# Patient Record
Sex: Female | Born: 1965 | Race: White | Hispanic: No | Smoking: Never smoker
Health system: Southern US, Community
[De-identification: ages and names within clinical notes are randomized; demographics above are authoritative.]

## PROBLEM LIST (undated history)

## (undated) DIAGNOSIS — E119 Type 2 diabetes mellitus without complications: Secondary | ICD-10-CM

## (undated) DIAGNOSIS — I219 Acute myocardial infarction, unspecified: Secondary | ICD-10-CM

## (undated) DIAGNOSIS — I10 Essential (primary) hypertension: Secondary | ICD-10-CM

## (undated) DIAGNOSIS — Z9889 Other specified postprocedural states: Secondary | ICD-10-CM

## (undated) DIAGNOSIS — R112 Nausea with vomiting, unspecified: Secondary | ICD-10-CM

## (undated) DIAGNOSIS — I2510 Atherosclerotic heart disease of native coronary artery without angina pectoris: Secondary | ICD-10-CM

## (undated) DIAGNOSIS — F32A Depression, unspecified: Secondary | ICD-10-CM

## (undated) DIAGNOSIS — F419 Anxiety disorder, unspecified: Secondary | ICD-10-CM

## (undated) HISTORY — PX: GANGLION CYST EXCISION: SHX1691

## (undated) HISTORY — PX: CORONARY ARTERY BYPASS GRAFT: SHX141

---

## 2004-12-16 ENCOUNTER — Ambulatory Visit: Payer: Self-pay | Admitting: Cardiovascular Disease

## 2005-01-06 ENCOUNTER — Encounter: Payer: Self-pay | Admitting: Internal Medicine

## 2005-01-17 ENCOUNTER — Encounter: Payer: Self-pay | Admitting: Internal Medicine

## 2005-02-17 ENCOUNTER — Encounter: Payer: Self-pay | Admitting: Internal Medicine

## 2005-03-20 ENCOUNTER — Encounter: Payer: Self-pay | Admitting: Internal Medicine

## 2005-09-15 ENCOUNTER — Ambulatory Visit: Payer: Self-pay | Admitting: Internal Medicine

## 2006-11-22 ENCOUNTER — Ambulatory Visit: Payer: Self-pay | Admitting: Internal Medicine

## 2007-02-28 ENCOUNTER — Ambulatory Visit: Payer: Self-pay | Admitting: Unknown Physician Specialty

## 2007-04-21 ENCOUNTER — Ambulatory Visit: Payer: Self-pay | Admitting: Internal Medicine

## 2009-02-03 ENCOUNTER — Emergency Department: Payer: Self-pay | Admitting: Emergency Medicine

## 2009-07-22 ENCOUNTER — Inpatient Hospital Stay: Payer: Self-pay | Admitting: Internal Medicine

## 2010-05-20 ENCOUNTER — Ambulatory Visit: Payer: Self-pay | Admitting: Internal Medicine

## 2010-07-16 ENCOUNTER — Ambulatory Visit: Payer: Self-pay | Admitting: Obstetrics & Gynecology

## 2010-07-20 HISTORY — PX: ABDOMINAL HYSTERECTOMY: SHX81

## 2010-07-24 ENCOUNTER — Ambulatory Visit: Payer: Self-pay | Admitting: Obstetrics & Gynecology

## 2010-07-26 LAB — PATHOLOGY REPORT

## 2010-10-13 ENCOUNTER — Ambulatory Visit: Payer: Self-pay | Admitting: Internal Medicine

## 2011-10-22 ENCOUNTER — Ambulatory Visit: Payer: Self-pay | Admitting: Internal Medicine

## 2012-04-04 ENCOUNTER — Ambulatory Visit: Payer: Self-pay | Admitting: Internal Medicine

## 2013-02-14 ENCOUNTER — Ambulatory Visit: Payer: Self-pay | Admitting: Internal Medicine

## 2013-02-23 ENCOUNTER — Ambulatory Visit: Payer: Self-pay | Admitting: Internal Medicine

## 2014-06-22 ENCOUNTER — Ambulatory Visit: Payer: Self-pay | Admitting: Nurse Practitioner

## 2015-07-24 ENCOUNTER — Other Ambulatory Visit: Payer: Self-pay | Admitting: Nurse Practitioner

## 2015-07-24 DIAGNOSIS — Z1231 Encounter for screening mammogram for malignant neoplasm of breast: Secondary | ICD-10-CM

## 2015-08-13 ENCOUNTER — Ambulatory Visit
Admission: RE | Admit: 2015-08-13 | Discharge: 2015-08-13 | Disposition: A | Discharge status: Home / Self Care | Payer: BLUE CROSS/BLUE SHIELD | Source: Ambulatory Visit | Attending: Nurse Practitioner | Admitting: Nurse Practitioner

## 2015-08-13 DIAGNOSIS — Z1231 Encounter for screening mammogram for malignant neoplasm of breast: Secondary | ICD-10-CM | POA: Diagnosis present

## 2016-06-23 ENCOUNTER — Other Ambulatory Visit: Payer: Self-pay | Admitting: Nurse Practitioner

## 2016-06-23 DIAGNOSIS — Z1231 Encounter for screening mammogram for malignant neoplasm of breast: Secondary | ICD-10-CM

## 2016-08-17 ENCOUNTER — Ambulatory Visit
Admission: RE | Admit: 2016-08-17 | Discharge: 2016-08-17 | Disposition: A | Discharge status: Home / Self Care | Payer: 59 | Source: Ambulatory Visit | Attending: Nurse Practitioner | Admitting: Nurse Practitioner

## 2016-08-17 DIAGNOSIS — Z1231 Encounter for screening mammogram for malignant neoplasm of breast: Secondary | ICD-10-CM | POA: Diagnosis present

## 2017-02-08 ENCOUNTER — Other Ambulatory Visit: Payer: Self-pay | Admitting: Nurse Practitioner

## 2017-02-08 DIAGNOSIS — E049 Nontoxic goiter, unspecified: Secondary | ICD-10-CM

## 2017-02-12 ENCOUNTER — Ambulatory Visit
Admission: RE | Admit: 2017-02-12 | Discharge: 2017-02-12 | Disposition: A | Discharge status: Home / Self Care | Payer: BLUE CROSS/BLUE SHIELD | Source: Ambulatory Visit | Attending: Nurse Practitioner | Admitting: Nurse Practitioner

## 2017-02-12 DIAGNOSIS — E049 Nontoxic goiter, unspecified: Secondary | ICD-10-CM | POA: Insufficient documentation

## 2017-06-30 ENCOUNTER — Other Ambulatory Visit: Payer: Self-pay | Admitting: Nurse Practitioner

## 2017-06-30 DIAGNOSIS — Z1231 Encounter for screening mammogram for malignant neoplasm of breast: Secondary | ICD-10-CM

## 2017-08-18 ENCOUNTER — Ambulatory Visit
Admission: RE | Admit: 2017-08-18 | Discharge: 2017-08-18 | Disposition: A | Discharge status: Home / Self Care | Payer: BLUE CROSS/BLUE SHIELD | Source: Ambulatory Visit | Attending: Nurse Practitioner | Admitting: Nurse Practitioner

## 2017-08-18 DIAGNOSIS — Z1231 Encounter for screening mammogram for malignant neoplasm of breast: Secondary | ICD-10-CM | POA: Diagnosis present

## 2018-08-12 ENCOUNTER — Other Ambulatory Visit: Payer: Self-pay | Admitting: Nurse Practitioner

## 2018-08-12 DIAGNOSIS — Z1231 Encounter for screening mammogram for malignant neoplasm of breast: Secondary | ICD-10-CM

## 2018-09-08 ENCOUNTER — Ambulatory Visit
Admission: RE | Admit: 2018-09-08 | Discharge: 2018-09-08 | Disposition: A | Discharge status: Home / Self Care | Payer: BLUE CROSS/BLUE SHIELD | Source: Ambulatory Visit | Attending: Nurse Practitioner | Admitting: Nurse Practitioner

## 2018-09-08 DIAGNOSIS — Z1231 Encounter for screening mammogram for malignant neoplasm of breast: Secondary | ICD-10-CM | POA: Insufficient documentation

## 2019-10-02 ENCOUNTER — Other Ambulatory Visit: Payer: Self-pay | Admitting: Nurse Practitioner

## 2019-10-02 DIAGNOSIS — Z1231 Encounter for screening mammogram for malignant neoplasm of breast: Secondary | ICD-10-CM

## 2019-10-24 ENCOUNTER — Ambulatory Visit
Admission: RE | Admit: 2019-10-24 | Discharge: 2019-10-24 | Disposition: A | Discharge status: Home / Self Care | Payer: BLUE CROSS/BLUE SHIELD | Source: Ambulatory Visit | Attending: Nurse Practitioner | Admitting: Nurse Practitioner

## 2019-10-24 DIAGNOSIS — Z1231 Encounter for screening mammogram for malignant neoplasm of breast: Secondary | ICD-10-CM | POA: Insufficient documentation

## 2020-02-16 ENCOUNTER — Ambulatory Visit (LOCAL_COMMUNITY_HEALTH_CENTER): Payer: Self-pay

## 2020-02-16 ENCOUNTER — Other Ambulatory Visit: Payer: Self-pay

## 2020-02-16 DIAGNOSIS — Z111 Encounter for screening for respiratory tuberculosis: Secondary | ICD-10-CM

## 2020-02-19 ENCOUNTER — Ambulatory Visit (LOCAL_COMMUNITY_HEALTH_CENTER): Payer: Self-pay

## 2020-02-19 ENCOUNTER — Other Ambulatory Visit: Payer: Self-pay

## 2020-02-19 DIAGNOSIS — Z111 Encounter for screening for respiratory tuberculosis: Secondary | ICD-10-CM

## 2020-02-19 LAB — TB SKIN TEST
Induration: 0 mm
TB Skin Test: NEGATIVE

## 2021-03-06 DIAGNOSIS — M65332 Trigger finger, left middle finger: Secondary | ICD-10-CM | POA: Insufficient documentation

## 2021-05-05 ENCOUNTER — Other Ambulatory Visit: Payer: Self-pay | Admitting: Nurse Practitioner

## 2021-05-05 DIAGNOSIS — Z1231 Encounter for screening mammogram for malignant neoplasm of breast: Secondary | ICD-10-CM

## 2021-05-20 ENCOUNTER — Other Ambulatory Visit: Payer: Self-pay

## 2021-05-20 ENCOUNTER — Ambulatory Visit
Admission: RE | Admit: 2021-05-20 | Discharge: 2021-05-20 | Disposition: A | Discharge status: Home / Self Care | Payer: Self-pay | Source: Ambulatory Visit | Attending: Nurse Practitioner | Admitting: Nurse Practitioner

## 2021-05-20 ENCOUNTER — Ambulatory Visit
Admission: RE | Admit: 2021-05-20 | Discharge: 2021-05-20 | Disposition: A | Discharge status: Home / Self Care | Payer: Self-pay | Attending: Nurse Practitioner | Admitting: Nurse Practitioner

## 2021-05-20 ENCOUNTER — Other Ambulatory Visit: Payer: Self-pay | Admitting: Nurse Practitioner

## 2021-05-20 DIAGNOSIS — R0600 Dyspnea, unspecified: Secondary | ICD-10-CM | POA: Insufficient documentation

## 2021-05-28 ENCOUNTER — Other Ambulatory Visit: Payer: Self-pay

## 2021-05-28 ENCOUNTER — Ambulatory Visit
Admission: RE | Admit: 2021-05-28 | Discharge: 2021-05-28 | Disposition: A | Discharge status: Home / Self Care | Payer: BLUE CROSS/BLUE SHIELD | Source: Ambulatory Visit | Attending: Nurse Practitioner | Admitting: Nurse Practitioner

## 2021-05-28 DIAGNOSIS — Z1231 Encounter for screening mammogram for malignant neoplasm of breast: Secondary | ICD-10-CM | POA: Insufficient documentation

## 2021-06-26 ENCOUNTER — Other Ambulatory Visit: Payer: Self-pay | Admitting: Specialist

## 2021-06-26 ENCOUNTER — Encounter
Admission: RE | Admit: 2021-06-26 | Discharge: 2021-06-26 | Disposition: A | Discharge status: Home / Self Care | Payer: BLUE CROSS/BLUE SHIELD | Source: Ambulatory Visit | Attending: Specialist | Admitting: Specialist

## 2021-06-26 ENCOUNTER — Other Ambulatory Visit: Payer: Self-pay

## 2021-06-26 HISTORY — DX: Type 2 diabetes mellitus without complications: E11.9

## 2021-06-26 HISTORY — DX: Other specified postprocedural states: Z98.890

## 2021-06-26 HISTORY — DX: Acute myocardial infarction, unspecified: I21.9

## 2021-06-26 HISTORY — DX: Anxiety disorder, unspecified: F41.9

## 2021-06-26 HISTORY — DX: Atherosclerotic heart disease of native coronary artery without angina pectoris: I25.10

## 2021-06-26 HISTORY — DX: Essential (primary) hypertension: I10

## 2021-06-26 HISTORY — DX: Depression, unspecified: F32.A

## 2021-06-26 HISTORY — DX: Other specified postprocedural states: R11.2

## 2021-06-26 NOTE — Pre-Procedure Instructions (Signed)
Sent request via fax to patient's PCP for recommendations on holding Aspirin or Plavix for upcoming surgery. Also awaiting most recent labs and EKG

## 2021-06-26 NOTE — Patient Instructions (Addendum)
Your procedure is scheduled on: 07/03/21 Report to DAY SURGERY DEPARTMENT LOCATED ON 2ND FLOOR MEDICAL MALL ENTRANCE. To find out your arrival time please call [phone] between 1PM - 3PM on 07/02/21.  Remember: Instructions that are not followed completely may result in serious medical risk, up to and including death, or upon the discretion of your surgeon and anesthesiologist your surgery may need to be rescheduled.     _X__ 1. Do not eat food after midnight the night before your procedure.                 No gum chewing or hard candies. You may drink clear liquids up to 2 hours                 before you are scheduled to arrive for your surgery- DO not drink clear                 liquids within 2 hours of the start of your surgery.                  Diabetics water only  __X__2.  On the morning of surgery brush your teeth with toothpaste and water, you                 may rinse your mouth with mouthwash if you wish.  Do not swallow any              toothpaste of mouthwash.     _X__ 3.  No Alcohol for 24 hours before or after surgery.   _X__ 4.  Do Not Smoke or use e-cigarettes For 24 Hours Prior to Your Surgery.                 Do not use any chewable tobacco products for at least 6 hours prior to                 surgery.  ____  5.  Bring all medications with you on the day of surgery if instructed.   __X__  6.  Notify your doctor if there is any change in your medical condition      (cold, fever, infections).     Do not wear jewelry, make-up, hairpins, clips or nail polish. Do not wear lotions, powders, or perfumes.  Do not shave body hair 48 hours prior to surgery. Men may shave face and neck. Do not bring valuables to the hospital.    [facility] is not responsible for any belongings or valuables.  Contacts, dentures/partials or body piercings may not be worn into surgery. Bring a case for your contacts, glasses or hearing aids, a denture cup will be supplied. Leave your  suitcase in the car. After surgery it may be brought to your room. For patients admitted to the hospital, discharge time is determined by your treatment team.   Patients discharged the day of surgery will not be allowed to drive home.   Please read over the following fact sheets that you were given:     __X__ Take these medicines the morning of surgery with A SIP OF WATER:    1. ezetimibe (ZETIA) 10 MG tablet  2. metoprolol succinate (TOPROL-XL) 25 MG 24 hr tablet  3. levocetirizine (XYZAL) 5 MG tablet if needed  4.  5.  6.  ____ Fleet Enema (as directed)   __X__ Use CHG Soap/SAGE wipes as directed    __x__ Use inhalers on the day of surgery  __X__ Stop Marcelline Deist 3 days prior to surgery    __X__ Take 1/2 of usual insulin dose the night before surgery. No insulin the morning          of surgery.   ____ Stop Blood Thinners Coumadin/Plavix/Xarelto/Pleta/Pradaxa/Eliquis/Effient/Aspirin  on   Or contact your Surgeon, Cardiologist or Medical Doctor regarding  ability to stop your blood thinners  __X__ Stop Anti-inflammatories 7 days before surgery such as Advil, Ibuprofen, Motrin,  BC or Goodies Powder, Naprosyn, Naproxen, Aleve, Aspirin    __X__ Stop all herbal supplements, fish oil or vitamin E until after surgery.    ____ Bring C-Pap to the hospital.    Patient has agreed to follow up with PCP for instructions on blood thinners. Will notify our NP.

## 2021-06-26 NOTE — H&P (Signed)
PREOPERATIVE H&P  Chief Complaint: Z61.096 Trigger finger, left middle finger  HPI: Leah Hinton is a 55 y.o. female who presents for preoperative history and physical with a diagnosis of M65.332 Trigger finger, left middle finger. Symptoms are rated as moderate to severe, and have been worsening.  She has failed non operative treatments including injection and splinting. This is significantly impairing activities of daily living.  She has elected for surgical management.   No past medical history on file. Past Surgical History:  Procedure Laterality Date   ABDOMINAL HYSTERECTOMY  2012   Social History   Socioeconomic History   Marital status: Widowed    Spouse name: Not on file   Number of children: Not on file   Years of education: Not on file   Highest education level: Not on file  Occupational History   Not on file  Tobacco Use   Smoking status: Not on file   Smokeless tobacco: Not on file  Substance and Sexual Activity   Alcohol use: Not on file   Drug use: Not on file   Sexual activity: Not on file  Other Topics Concern   Not on file  Social History Narrative   Not on file   Social Determinants of Health   Financial Resource Strain: Not on file  Food Insecurity: Not on file  Transportation Needs: Not on file  Physical Activity: Not on file  Stress: Not on file  Social Connections: Not on file   Family History  Problem Relation Age of Onset   Breast cancer Maternal Grandmother 50   Allergies  Allergen Reactions   Metoprolol Tartrate     Headaches, tolerates metoprolol succinate    Prior to Admission medications   Medication Sig Start Date End Date Taking? Authorizing Provider  acetaminophen (TYLENOL) 500 MG tablet Take 1,000 mg by mouth every 6 (six) hours as needed for moderate pain.   Yes [provider]  albuterol (VENTOLIN HFA) 108 (90 Base) MCG/ACT inhaler Inhale 1 puff into the lungs every 6 (six) hours as needed for shortness of breath.  05/20/21  Yes [provider]  aspirin EC 81 MG tablet Take 81 mg by mouth daily. Swallow whole.   Yes [provider]  atorvastatin (LIPITOR) 80 MG tablet Take 80 mg by mouth at bedtime.   Yes [provider]  benzonatate (TESSALON) 100 MG capsule Take 100 mg by mouth every 8 (eight) hours as needed for cough. 05/16/21  Yes [provider]  Cholecalciferol (DIALYVITE VITAMIN D 5000) 125 MCG (5000 UT) capsule Take 5,000 Units by mouth daily.   Yes [provider]  citalopram (CELEXA) 40 MG tablet Take 40 mg by mouth at bedtime.   Yes [provider]  clopidogrel (PLAVIX) 75 MG tablet Take 75 mg by mouth daily.   Yes [provider]  ezetimibe (ZETIA) 10 MG tablet Take 10 mg by mouth daily.   Yes [provider]  FARXIGA 10 MG TABS tablet Take 10 mg by mouth daily. 04/09/21  Yes [provider]  glipiZIDE (GLUCOTROL XL) 10 MG 24 hr tablet Take 10 mg by mouth daily. 04/12/21  Yes [provider]  insulin degludec (TRESIBA FLEXTOUCH) 100 UNIT/ML FlexTouch Pen Inject 50 Units into the skin at bedtime.   Yes [provider]  levocetirizine (XYZAL) 5 MG tablet Take 5 mg by mouth at bedtime as needed for allergies. 05/16/21  Yes [provider]  lisinopril (ZESTRIL) 5 MG tablet Take 5 mg  by mouth daily.   Yes [provider]  metoprolol succinate (TOPROL-XL) 25 MG 24 hr tablet Take 25 mg by mouth daily. 03/22/21  Yes [provider]  zolpidem (AMBIEN) 5 MG tablet Take 5 mg by mouth at bedtime.   Yes [provider]     Positive ROS: All other systems have been reviewed and were otherwise negative with the exception of those mentioned in the HPI and as above.  Physical Exam: General: Alert, no acute distress Cardiovascular: No pedal edema. Heart is regular and without murmur.  Respiratory: No cyanosis, no use of accessory musculature. Lungs are clear. GI: No organomegaly,  abdomen is soft and non-tender Skin: No lesions in the area of chief complaint Neurologic: Sensation intact distally Psychiatric: Patient is competent for consent with normal mood and affect Lymphatic: No axillary or cervical lymphadenopathy  MUSCULOSKELETAL: tender A-1 pulley left middle finger. Catching of finger with flexion. NV normal.  Skin intact  Assessment: M65.332 Trigger finger, left middle finger  Plan: Plan for Procedure(s): RELEASE TRIGGER FINGER/A-1 PULLEY -  INCISION, TENDON SHEATH (SURG)  The risks benefits and alternatives were discussed with the patient including but not limited to the risks of nonoperative treatment, versus surgical intervention including infection, bleeding, nerve injury,  blood clots, cardiopulmonary complications, morbidity, mortality, among others, and they were willing to proceed.   Valinda Hoar, MD [phone]   06/26/2021 10:23 AM

## 2021-06-27 ENCOUNTER — Other Ambulatory Visit
Admission: RE | Admit: 2021-06-27 | Discharge: 2021-06-27 | Disposition: A | Discharge status: Home / Self Care | Payer: BLUE CROSS/BLUE SHIELD | Source: Ambulatory Visit | Attending: Specialist | Admitting: Specialist

## 2021-06-27 DIAGNOSIS — R001 Bradycardia, unspecified: Secondary | ICD-10-CM | POA: Insufficient documentation

## 2021-06-27 DIAGNOSIS — Z01810 Encounter for preprocedural cardiovascular examination: Secondary | ICD-10-CM | POA: Insufficient documentation

## 2021-07-03 ENCOUNTER — Ambulatory Visit: Payer: BLUE CROSS/BLUE SHIELD | Admitting: Urgent Care

## 2021-07-03 ENCOUNTER — Encounter: Payer: Self-pay | Admitting: Specialist

## 2021-07-03 ENCOUNTER — Encounter
Admission: RE | Disposition: A | Discharge status: Home / Self Care | Payer: Self-pay | Source: Home / Self Care | Attending: Specialist

## 2021-07-03 ENCOUNTER — Other Ambulatory Visit: Payer: Self-pay

## 2021-07-03 ENCOUNTER — Ambulatory Visit
Admission: RE | Admit: 2021-07-03 | Discharge: 2021-07-03 | Disposition: A | Discharge status: Home / Self Care | Payer: BLUE CROSS/BLUE SHIELD | Attending: Specialist | Admitting: Specialist

## 2021-07-03 DIAGNOSIS — Z951 Presence of aortocoronary bypass graft: Secondary | ICD-10-CM | POA: Insufficient documentation

## 2021-07-03 DIAGNOSIS — E119 Type 2 diabetes mellitus without complications: Secondary | ICD-10-CM | POA: Insufficient documentation

## 2021-07-03 DIAGNOSIS — I2510 Atherosclerotic heart disease of native coronary artery without angina pectoris: Secondary | ICD-10-CM | POA: Insufficient documentation

## 2021-07-03 DIAGNOSIS — I252 Old myocardial infarction: Secondary | ICD-10-CM | POA: Insufficient documentation

## 2021-07-03 DIAGNOSIS — I10 Essential (primary) hypertension: Secondary | ICD-10-CM | POA: Diagnosis not present

## 2021-07-03 DIAGNOSIS — F32A Depression, unspecified: Secondary | ICD-10-CM | POA: Insufficient documentation

## 2021-07-03 DIAGNOSIS — F419 Anxiety disorder, unspecified: Secondary | ICD-10-CM | POA: Insufficient documentation

## 2021-07-03 DIAGNOSIS — M65332 Trigger finger, left middle finger: Secondary | ICD-10-CM | POA: Insufficient documentation

## 2021-07-03 HISTORY — PX: TRIGGER FINGER RELEASE: SHX641

## 2021-07-03 LAB — GLUCOSE, CAPILLARY
Glucose-Capillary: 196 mg/dL — ABNORMAL HIGH (ref 70–99)
Glucose-Capillary: 212 mg/dL — ABNORMAL HIGH (ref 70–99)

## 2021-07-03 SURGERY — RELEASE, A1 PULLEY, FOR TRIGGER FINGER
Anesthesia: General | Laterality: Left

## 2021-07-03 MED ORDER — PHENYLEPHRINE HCL (PRESSORS) 10 MG/ML IV SOLN
INTRAVENOUS | Status: AC
  Filled 2021-07-03: qty 1

## 2021-07-03 MED ORDER — DEXMEDETOMIDINE HCL IN NACL 200 MCG/50ML IV SOLN
INTRAVENOUS | Status: AC
  Filled 2021-07-03: qty 50

## 2021-07-03 MED ORDER — ONDANSETRON HCL 4 MG/2ML IJ SOLN: 4.0000 mg | Freq: Once | INTRAMUSCULAR | Status: DC | PRN

## 2021-07-03 MED ORDER — EPHEDRINE 5 MG/ML INJ
INTRAVENOUS | Status: AC
  Filled 2021-07-03: qty 5

## 2021-07-03 MED ORDER — FENTANYL CITRATE (PF) 100 MCG/2ML IJ SOLN
INTRAMUSCULAR | Status: AC
  Filled 2021-07-03: qty 2

## 2021-07-03 MED ORDER — CEFAZOLIN SODIUM-DEXTROSE 2-4 GM/100ML-% IV SOLN
2.0000 g | INTRAVENOUS | Status: AC
  Administered 2021-07-03: 2 g via INTRAVENOUS

## 2021-07-03 MED ORDER — 0.9 % SODIUM CHLORIDE (POUR BTL) OPTIME
TOPICAL | Status: DC | PRN
  Administered 2021-07-03: 60 mL

## 2021-07-03 MED ORDER — BUPIVACAINE HCL (PF) 0.5 % IJ SOLN
INTRAMUSCULAR | Status: AC
  Filled 2021-07-03: qty 30

## 2021-07-03 MED ORDER — GABAPENTIN 300 MG PO CAPS: 300.0000 mg | ORAL_CAPSULE | ORAL | Status: AC

## 2021-07-03 MED ORDER — PROPOFOL 10 MG/ML IV BOLUS
INTRAVENOUS | Status: DC | PRN
  Administered 2021-07-03: 150 mg via INTRAVENOUS

## 2021-07-03 MED ORDER — FENTANYL CITRATE (PF) 100 MCG/2ML IJ SOLN: 25.0000 ug | INTRAMUSCULAR | Status: DC | PRN

## 2021-07-03 MED ORDER — CHLORHEXIDINE GLUCONATE CLOTH 2 % EX PADS: 6.0000 | MEDICATED_PAD | Freq: Once | CUTANEOUS | Status: DC

## 2021-07-03 MED ORDER — APREPITANT 40 MG PO CAPS
ORAL_CAPSULE | ORAL | Status: AC
  Administered 2021-07-03: 40 mg via ORAL
  Filled 2021-07-03: qty 1

## 2021-07-03 MED ORDER — ONDANSETRON HCL 4 MG/2ML IJ SOLN
INTRAMUSCULAR | Status: AC
  Filled 2021-07-03: qty 2

## 2021-07-03 MED ORDER — LIDOCAINE HCL (PF) 2 % IJ SOLN
INTRAMUSCULAR | Status: AC
  Filled 2021-07-03: qty 5

## 2021-07-03 MED ORDER — CLINDAMYCIN PHOSPHATE 600 MG/50ML IV SOLN
INTRAVENOUS | Status: AC
  Filled 2021-07-03: qty 50

## 2021-07-03 MED ORDER — FENTANYL CITRATE (PF) 100 MCG/2ML IJ SOLN
INTRAMUSCULAR | Status: DC | PRN
  Administered 2021-07-03 (×4): 25 ug via INTRAVENOUS

## 2021-07-03 MED ORDER — BUPIVACAINE HCL (PF) 0.25 % IJ SOLN
INTRAMUSCULAR | Status: AC
  Filled 2021-07-03: qty 30

## 2021-07-03 MED ORDER — MIDAZOLAM HCL 2 MG/2ML IJ SOLN
INTRAMUSCULAR | Status: DC | PRN
  Administered 2021-07-03: 1 mg via INTRAVENOUS

## 2021-07-03 MED ORDER — OXYCODONE HCL 5 MG/5ML PO SOLN: 5.0000 mg | Freq: Once | ORAL | Status: DC | PRN

## 2021-07-03 MED ORDER — ORAL CARE MOUTH RINSE: 15.0000 mL | Freq: Once | OROMUCOSAL | Status: AC

## 2021-07-03 MED ORDER — GABAPENTIN 300 MG PO CAPS
ORAL_CAPSULE | ORAL | Status: AC
  Administered 2021-07-03: 300 mg via ORAL
  Filled 2021-07-03: qty 1

## 2021-07-03 MED ORDER — MELOXICAM 7.5 MG PO TABS: 15.0000 mg | ORAL_TABLET | ORAL | Status: AC

## 2021-07-03 MED ORDER — CHLORHEXIDINE GLUCONATE 0.12 % MT SOLN: 15.0000 mL | Freq: Once | OROMUCOSAL | Status: AC

## 2021-07-03 MED ORDER — ACETAMINOPHEN 10 MG/ML IV SOLN
INTRAVENOUS | Status: DC | PRN
  Administered 2021-07-03: 1000 mg via INTRAVENOUS

## 2021-07-03 MED ORDER — CLINDAMYCIN PHOSPHATE 600 MG/50ML IV SOLN
600.0000 mg | INTRAVENOUS | Status: AC
  Administered 2021-07-03: 600 mg via INTRAVENOUS

## 2021-07-03 MED ORDER — ACETAMINOPHEN 10 MG/ML IV SOLN
INTRAVENOUS | Status: AC
  Filled 2021-07-03: qty 100

## 2021-07-03 MED ORDER — FAMOTIDINE 20 MG PO TABS
ORAL_TABLET | ORAL | Status: AC
  Administered 2021-07-03: 20 mg via ORAL
  Filled 2021-07-03: qty 1

## 2021-07-03 MED ORDER — PROPOFOL 500 MG/50ML IV EMUL
INTRAVENOUS | Status: AC
  Filled 2021-07-03: qty 50

## 2021-07-03 MED ORDER — OXYCODONE HCL 5 MG PO TABS: 5.0000 mg | ORAL_TABLET | Freq: Once | ORAL | Status: DC | PRN

## 2021-07-03 MED ORDER — SODIUM CHLORIDE 0.9 % IV SOLN: INTRAVENOUS | Status: DC

## 2021-07-03 MED ORDER — FAMOTIDINE 20 MG PO TABS: 20.0000 mg | ORAL_TABLET | Freq: Once | ORAL | Status: AC

## 2021-07-03 MED ORDER — ONDANSETRON HCL 4 MG/2ML IJ SOLN
INTRAMUSCULAR | Status: DC | PRN
  Administered 2021-07-03: 4 mg via INTRAVENOUS

## 2021-07-03 MED ORDER — DEXAMETHASONE SODIUM PHOSPHATE 10 MG/ML IJ SOLN
INTRAMUSCULAR | Status: AC
  Filled 2021-07-03: qty 1

## 2021-07-03 MED ORDER — DEXAMETHASONE SODIUM PHOSPHATE 10 MG/ML IJ SOLN
INTRAMUSCULAR | Status: DC | PRN
Start: 2021-07-03 — End: 2021-07-03
  Administered 2021-07-03: 10 mg via INTRAVENOUS

## 2021-07-03 MED ORDER — MELOXICAM 7.5 MG PO TABS
ORAL_TABLET | ORAL | Status: AC
  Administered 2021-07-03: 15 mg via ORAL
  Filled 2021-07-03: qty 2

## 2021-07-03 MED ORDER — MIDAZOLAM HCL 2 MG/2ML IJ SOLN
INTRAMUSCULAR | Status: AC
  Filled 2021-07-03: qty 2

## 2021-07-03 MED ORDER — APREPITANT 40 MG PO CAPS: 40.0000 mg | ORAL_CAPSULE | Freq: Once | ORAL | Status: AC

## 2021-07-03 MED ORDER — HYDROCODONE-ACETAMINOPHEN 5-325 MG PO TABS: 1.0000 | ORAL_TABLET | Freq: Four times a day (QID) | ORAL | 0 refills | Status: DC | PRN

## 2021-07-03 MED ORDER — PHENYLEPHRINE HCL-NACL 20-0.9 MG/250ML-% IV SOLN
INTRAVENOUS | Status: AC
  Filled 2021-07-03: qty 250

## 2021-07-03 MED ORDER — CEFAZOLIN SODIUM-DEXTROSE 2-4 GM/100ML-% IV SOLN
INTRAVENOUS | Status: AC
  Filled 2021-07-03: qty 100

## 2021-07-03 MED ORDER — BUPIVACAINE HCL (PF) 0.5 % IJ SOLN
INTRAMUSCULAR | Status: DC | PRN
  Administered 2021-07-03: 10 mL

## 2021-07-03 MED ORDER — CHLORHEXIDINE GLUCONATE 0.12 % MT SOLN
OROMUCOSAL | Status: AC
  Administered 2021-07-03: 15 mL via OROMUCOSAL
  Filled 2021-07-03: qty 15

## 2021-07-03 MED ORDER — LIDOCAINE HCL (CARDIAC) PF 100 MG/5ML IV SOSY
PREFILLED_SYRINGE | INTRAVENOUS | Status: DC | PRN
  Administered 2021-07-03: 50 mg via INTRAVENOUS

## 2021-07-03 MED ORDER — EPHEDRINE SULFATE 50 MG/ML IJ SOLN
INTRAMUSCULAR | Status: DC | PRN
  Administered 2021-07-03 (×2): 5 mg via INTRAVENOUS

## 2021-07-03 MED ORDER — ACETAMINOPHEN 10 MG/ML IV SOLN: 1000.0000 mg | Freq: Once | INTRAVENOUS | Status: DC | PRN

## 2021-07-03 SURGICAL SUPPLY — 22 items
APL PRP STRL LF DISP 70% ISPRP (MISCELLANEOUS) ×1
BLADE SURG MINI STRL (BLADE) ×2 IMPLANT
BNDG ESMARK 4X12 TAN STRL LF (GAUZE/BANDAGES/DRESSINGS) ×2 IMPLANT
CHLORAPREP W/TINT 26 (MISCELLANEOUS) ×2 IMPLANT
DRSG GAUZE FLUFF 36X18 (GAUZE/BANDAGES/DRESSINGS) ×2 IMPLANT
GAUZE 4X4 16PLY ~~LOC~~+RFID DBL (SPONGE) ×2 IMPLANT
GAUZE XEROFORM 1X8 LF (GAUZE/BANDAGES/DRESSINGS) ×2 IMPLANT
GLOVE SURG ENC MOIS LTX SZ8 (GLOVE) ×2 IMPLANT
GOWN STRL REUS W/ TWL LRG LVL3 (GOWN DISPOSABLE) ×1 IMPLANT
GOWN STRL REUS W/TWL LRG LVL3 (GOWN DISPOSABLE) ×4
KIT TURNOVER KIT A (KITS) ×2 IMPLANT
MANIFOLD NEPTUNE II (INSTRUMENTS) ×2 IMPLANT
NS IRRIG 500ML POUR BTL (IV SOLUTION) ×2 IMPLANT
PACK EXTREMITY ARMC (MISCELLANEOUS) ×2 IMPLANT
PAD PREP 24X41 OB/GYN DISP (PERSONAL CARE ITEMS) ×2 IMPLANT
STOCKINETTE 48X4 2 PLY STRL (GAUZE/BANDAGES/DRESSINGS) ×1 IMPLANT
STOCKINETTE BIAS CUT 3 980034 (MISCELLANEOUS) ×1 IMPLANT
STOCKINETTE STRL 4IN 9604848 (GAUZE/BANDAGES/DRESSINGS) ×2 IMPLANT
SUT ETHILON 5-0 (SUTURE) ×2
SUT ETHILON 5-0 C-3 18XMFL BLK (SUTURE) ×1
SUTURE ETHLN 5-0 C3 18XMF BLK (SUTURE) ×1 IMPLANT
WATER STERILE IRR 500ML POUR (IV SOLUTION) ×2 IMPLANT

## 2021-07-03 NOTE — Op Note (Signed)
07/03/2021  8:36 AM  PATIENT:  Leah Hinton    PRE-OPERATIVE DIAGNOSIS:  E01.007 Trigger finger, left middle finger  POST-OPERATIVE DIAGNOSIS:  Same  PROCEDURE:  RELEASE TRIGGER FINGER/A-1 PULLEY -  INCISION, TENDON SHEATH (SURG)  SURGEON:  Valinda Hoar, MD  ANESTHESIA:   General  PREOPERATIVE INDICATIONS:  Leah Hinton is a  55 y.o. female with a diagnosis of M65.332 Trigger finger, left middle finger who failed conservative measures and elected for surgical management.    The risks benefits and alternatives were discussed with the patient preoperatively including but not limited to the risks of infection, bleeding, nerve injury, cardiopulmonary complications, the need for revision surgery, among others, and the patient was willing to proceed.  EBL: None   TOURNIQUET TIME: 14 MIN  OPERATIVE FINDINGS: Tenosynovitis of sheath  OPERATIVE PROCEDURE: The patient was brought to the operating room and underwent satisfactory general LMA anesthesia. The operative arm was prepped and draped sterilly and tourniquet inflated to Hg. A transverse incision was made over the A-1 pulley of the left middle  finger and blunt dissection carried out down to the tendon sheath.  Spring retractors were inserted providing good visualization of the pulley and protecting the neurovascular structures. The pulley was excised entirely and the tendons examined, freed up from adhesions, and replaced. The sheath was released proximally and distally and the wound irrigated. The finger moved well without catching. The incision was closed with 4-0 nylon suture and 1/2% sensorcaine injected. A dry sterile dressing was applied and the tourniquet released with good return of blood flow to the hand. The patient was awakened and taken to the PACU in good condition.   Valinda Hoar, MD

## 2021-07-03 NOTE — Discharge Instructions (Signed)

## 2021-07-03 NOTE — Anesthesia Preprocedure Evaluation (Signed)
Anesthesia Evaluation  Patient identified by MRN, date of birth, ID band Patient awake    Reviewed: Allergy & Precautions, NPO status , Patient's Chart, lab work & pertinent test results  History of Anesthesia Complications (+) PONV and history of anesthetic complications  Airway Mallampati: II  TM Distance: >3 FB Neck ROM: Full    Dental no notable dental hx. (+) Teeth Intact   Pulmonary neg pulmonary ROS, neg sleep apnea, neg COPD, Patient abstained from smoking.Not current smoker,    Pulmonary exam normal breath sounds clear to auscultation       Cardiovascular Exercise Tolerance: Good METShypertension, + CAD, + Past MI and + CABG  (-) dysrhythmias  Rhythm:Regular Rate:Normal - Systolic murmurs    Neuro/Psych PSYCHIATRIC DISORDERS Anxiety Depression negative neurological ROS     GI/Hepatic neg GERD  ,(+)     (-) substance abuse  ,   Endo/Other  diabetes  Renal/GU negative Renal ROS     Musculoskeletal   Abdominal   Peds  Hematology   Anesthesia Other Findings Past Medical History: No date: Anxiety No date: Coronary artery disease No date: Depression No date: Diabetes mellitus without complication (HCC) No date: Hypertension No date: Myocardial infarction (HCC) No date: PONV (postoperative nausea and vomiting)  Reproductive/Obstetrics                             Anesthesia Physical Anesthesia Plan  ASA: 3  Anesthesia Plan: General   Post-op Pain Management: Minimal or no pain anticipated   Induction: Intravenous  PONV Risk Score and Plan: 4 or greater and Ondansetron, Dexamethasone, Aprepitant and Midazolam  Airway Management Planned: Oral ETT and LMA  Additional Equipment: None  Intra-op Plan:   Post-operative Plan: Extubation in OR  Informed Consent: I have reviewed the patients History and Physical, chart, labs and discussed the procedure including the risks,  benefits and alternatives for the proposed anesthesia with the patient or authorized representative who has indicated his/her understanding and acceptance.     Dental advisory given  Plan Discussed with: CRNA and Surgeon  Anesthesia Plan Comments: (Discussed risks of anesthesia with patient, including PONV, sore throat, lip/dental/eye damage. Rare risks discussed as well, such as cardiorespiratory and neurological sequelae, and allergic reactions. Discussed the role of CRNA in patient's perioperative care. Patient understands. LMA vs ETT depending on surgical needs)        Anesthesia Quick Evaluation

## 2021-07-03 NOTE — H&P (Signed)
THE PATIENT WAS SEEN PRIOR TO SURGERY TODAY.  HISTORY, ALLERGIES, HOME MEDICATIONS AND OPERATIVE PROCEDURE WERE REVIEWED. RISKS AND BENEFITS OF SURGERY DISCUSSED WITH PATIENT AGAIN.  NO CHANGES FROM INITIAL HISTORY AND PHYSICAL NOTED.    

## 2021-07-03 NOTE — Transfer of Care (Signed)
Immediate Anesthesia Transfer of Care Note  Patient: Leah Hinton  Procedure(s) Performed: RELEASE TRIGGER FINGER/A-1 PULLEY -  INCISION, TENDON SHEATH (SURG) (Left)  Patient Location: PACU  Anesthesia Type:General  Level of Consciousness: sedated  Airway & Oxygen Therapy: Patient Spontanous Breathing and Patient connected to face mask oxygen  Post-op Assessment: Report given to RN and Post -op Vital signs reviewed and stable  Post vital signs: Reviewed and stable  Last Vitals:  Vitals Value Taken Time  BP 129/67 07/03/21 0845  Temp 36.3 C 07/03/21 0835  Pulse 50 07/03/21 0847  Resp 15 07/03/21 0847  SpO2 100 % 07/03/21 0847  Vitals shown include unvalidated device data.  Last Pain:  Vitals:   07/03/21 0835  TempSrc:   PainSc: Asleep         Complications: No notable events documented.

## 2021-07-03 NOTE — Anesthesia Postprocedure Evaluation (Signed)
Anesthesia Post Note  Patient: Leah Hinton  Procedure(s) Performed: RELEASE TRIGGER FINGER/A-1 PULLEY -  INCISION, TENDON SHEATH (SURG) (Left)  Patient location during evaluation: PACU Anesthesia Type: General Level of consciousness: awake and alert Pain management: pain level controlled Vital Signs Assessment: post-procedure vital signs reviewed and stable Respiratory status: spontaneous breathing, nonlabored ventilation, respiratory function stable and patient connected to nasal cannula oxygen Cardiovascular status: blood pressure returned to baseline and stable Postop Assessment: no apparent nausea or vomiting Anesthetic complications: no   No notable events documented.   Last Vitals:  Vitals:   07/03/21 0900 07/03/21 0911  BP: (!) 148/69 (!) 152/67  Pulse: (!) 56 (!) 52  Resp: 14 18  Temp: (!) 36.1 C (!) 36.1 C  SpO2: 98% 98%    Last Pain:  Vitals:   07/03/21 0911  TempSrc: Temporal  PainSc: 0-No pain                 Corinda Gubler

## 2021-07-03 NOTE — Anesthesia Procedure Notes (Signed)
Procedure Name: LMA Insertion Date/Time: 07/03/2021 7:44 AM Performed by: Ginger Carne, CRNA Pre-anesthesia Checklist: Patient identified, Emergency Drugs available, Suction available, Patient being monitored and Timeout performed Patient Re-evaluated:Patient Re-evaluated prior to induction Oxygen Delivery Method: Circle system utilized Preoxygenation: Pre-oxygenation with 100% oxygen Induction Type: IV induction LMA: LMA inserted LMA Size: 4.0 Tube type: Oral Number of attempts: 1 Placement Confirmation: positive ETCO2 and breath sounds checked- equal and bilateral Tube secured with: Tape Dental Injury: Teeth and Oropharynx as per pre-operative assessment

## 2021-07-04 ENCOUNTER — Encounter: Payer: Self-pay | Admitting: Specialist

## 2022-05-22 ENCOUNTER — Other Ambulatory Visit: Payer: Self-pay | Admitting: Nurse Practitioner

## 2022-05-22 DIAGNOSIS — Z1231 Encounter for screening mammogram for malignant neoplasm of breast: Secondary | ICD-10-CM

## 2022-08-03 DIAGNOSIS — E1165 Type 2 diabetes mellitus with hyperglycemia: Secondary | ICD-10-CM | POA: Diagnosis not present

## 2022-08-03 DIAGNOSIS — I10 Essential (primary) hypertension: Secondary | ICD-10-CM | POA: Diagnosis not present

## 2022-08-03 DIAGNOSIS — E782 Mixed hyperlipidemia: Secondary | ICD-10-CM | POA: Diagnosis not present

## 2022-08-04 DIAGNOSIS — R69 Illness, unspecified: Secondary | ICD-10-CM | POA: Diagnosis not present

## 2022-08-04 DIAGNOSIS — E1165 Type 2 diabetes mellitus with hyperglycemia: Secondary | ICD-10-CM | POA: Diagnosis not present

## 2022-08-04 DIAGNOSIS — G4700 Insomnia, unspecified: Secondary | ICD-10-CM | POA: Diagnosis not present

## 2022-08-04 DIAGNOSIS — R945 Abnormal results of liver function studies: Secondary | ICD-10-CM | POA: Diagnosis not present

## 2022-08-04 DIAGNOSIS — E782 Mixed hyperlipidemia: Secondary | ICD-10-CM | POA: Diagnosis not present

## 2022-08-04 DIAGNOSIS — I10 Essential (primary) hypertension: Secondary | ICD-10-CM | POA: Diagnosis not present

## 2022-08-21 ENCOUNTER — Ambulatory Visit: Payer: Medicaid Other | Admitting: Cardiovascular Disease

## 2022-08-31 ENCOUNTER — Other Ambulatory Visit: Payer: Self-pay | Admitting: Nurse Practitioner

## 2022-09-10 ENCOUNTER — Ambulatory Visit: Payer: Medicaid Other | Admitting: Cardiovascular Disease

## 2022-09-20 ENCOUNTER — Other Ambulatory Visit: Payer: Self-pay | Admitting: Cardiovascular Disease

## 2022-09-20 DIAGNOSIS — E782 Mixed hyperlipidemia: Secondary | ICD-10-CM

## 2022-09-22 ENCOUNTER — Ambulatory Visit: Payer: Medicaid Other | Admitting: Cardiovascular Disease

## 2022-09-22 ENCOUNTER — Encounter: Payer: Self-pay | Admitting: Cardiovascular Disease

## 2022-09-22 VITALS — BP 130/70 | HR 78 | Ht 63.0 in | Wt 141.2 lb

## 2022-09-22 DIAGNOSIS — E782 Mixed hyperlipidemia: Secondary | ICD-10-CM | POA: Diagnosis not present

## 2022-09-22 DIAGNOSIS — I25798 Atherosclerosis of other coronary artery bypass graft(s) with other forms of angina pectoris: Secondary | ICD-10-CM | POA: Diagnosis not present

## 2022-09-22 DIAGNOSIS — E113531 Type 2 diabetes mellitus with proliferative diabetic retinopathy with traction retinal detachment not involving the macula, right eye: Secondary | ICD-10-CM

## 2022-09-22 DIAGNOSIS — I10 Essential (primary) hypertension: Secondary | ICD-10-CM

## 2022-09-22 DIAGNOSIS — I340 Nonrheumatic mitral (valve) insufficiency: Secondary | ICD-10-CM | POA: Diagnosis not present

## 2022-09-22 MED ORDER — CLOPIDOGREL BISULFATE 75 MG PO TABS: 75.0000 mg | ORAL_TABLET | Freq: Every day | ORAL | 2 refills | Status: DC

## 2022-09-22 NOTE — Progress Notes (Signed)
Cardiology Office Note   Date:  09/22/2022   ID:  Leah Hinton, DOB 05-28-1966, MRN [id number]  PCP:  Evern Bio, NP  Cardiologist:  Neoma Laming, MD      History of Present Illness: Leah Hinton is a 57 y.o. female who presents for  Chief Complaint  Patient presents with   Follow-up    4 month follow up    Doing well, no chest pain or SOB      Past Medical History:  Diagnosis Date   Anxiety    Coronary artery disease    Depression    Diabetes mellitus without complication ([place])    Hypertension    Myocardial infarction ([place])    PONV (postoperative nausea and vomiting)      Past Surgical History:  Procedure Laterality Date   ABDOMINAL HYSTERECTOMY  2012   CESAREAN SECTION     x 2   CORONARY ARTERY BYPASS GRAFT     GANGLION CYST EXCISION Left    TRIGGER FINGER RELEASE Left 07/03/2021   Procedure: RELEASE TRIGGER FINGER/A-1 PULLEY -  INCISION, TENDON SHEATH (SURG);  Surgeon: Earnestine Leys, MD;  Location: [facility] ORS;  Service: Orthopedics;  Laterality: Left;   REASON FOR VISIT  Referred by Neoma Laming.        TESTS  Imaging: Computed Tomographic Angiography:  Cardiac multidetector CT was performed paying particular attention to the coronary arteries for the diagnosis of: CAD. Diagnostic Drugs:  Administered iohexol (Omnipaque) through an antecubital vein and images from the examination were analyzed for the presence and extent of coronary artery disease, using 3D image processing software. 100 mL of non-ionic contrast (Omnipaque) was used.        TEST CONCLUSIONS  Quality of study: Fair    1 - Calcium score is 1178.8.  2 - Right dominant system.  3 - Grafts are patent including LIMA to LAD, SVG to OM, and PDA.      Neoma Laming MD  Electronically signed by: Neoma Laming     Date: 10/21/2016 11:11 REASON FOR VISIT  Visit for: Echocardiogram/I 35.1  Sex:   Female  wt= 154   lbs.  BP=112/60  Height= 63   inches.        TESTS   Imaging: Echocardiogram:  An echocardiogram in (2-d) mode was performed and in Doppler mode with color flow velocity mapping was performed. The aortic valve cusps are abnormal 1.3  cm, flow velocity 1.7    m/s, and systolic calculated mean flow gradient 4  mmHg. Mitral valve diastolic peak flow velocity E .97    m/s and E/A ratio 1.5. Aortic root diameter 2.8  cm. The LVOT internal diameter 1.5  cm and flow velocity was abnormal 1.4   m/s. LV systolic dimension 2.2  cm, diastolic 4.3  cm, posterior wall thickness 1.1    cm, fractional shortening 30 %, and EF 60 %. IVS thickness 1.2    cm. LA dimension 3.9 cm  RIGHT atrium=  16.4  cm2. Mitral Valve has Trace Regurgitation. Aortic Valve has Mild to Moderate Regurgitation. Tricuspid Valve has Trace Regurgitation.     ASSESSMENT  Technically adequate study.  Normal chamber sizes.  Normal left ventricular systolic function.  Normal right ventricular systolic function.  Normal right ventricular diastolic function.  Normal left ventricular wall motion.  Normal right ventricular wall motion.  Trace tricuspid regurgitation.  Normal pulmonary artery pressure.  Trace mitral regurgitation.  Mild to Moderate aortic regurgitation.  No pericardial  effusion.  Mildly dilated Right ventricle  Mild LVH.     THERAPY   Referring physician: Dionisio David  Sonographer: STU.      Neoma Laming MD  Electronically signed by: Neoma Laming     Date: 09/04/2021 11:43   Current Outpatient Medications  Medication Sig Dispense Refill   acetaminophen (TYLENOL) 500 MG tablet Take 1,000 mg by mouth every 6 (six) hours as needed for moderate pain.     albuterol (VENTOLIN HFA) 108 (90 Base) MCG/ACT inhaler Inhale 1 puff into the lungs every 6 (six) hours as needed for shortness of breath.     aspirin EC 81 MG tablet Take 81 mg by mouth daily. Swallow whole.     atorvastatin (LIPITOR) 80 MG tablet TAKE 1 TABLET BY MOUTH EVERY DAY 90 tablet 0    citalopram (CELEXA) 40 MG tablet Take 40 mg by mouth at bedtime.     ezetimibe (ZETIA) 10 MG tablet Take 10 mg by mouth daily.     FARXIGA 10 MG TABS tablet Take 10 mg by mouth daily.     glipiZIDE (GLUCOTROL XL) 10 MG 24 hr tablet Take 10 mg by mouth daily.     HYDROcodone-acetaminophen (NORCO) 5-325 MG tablet Take 1-2 tablets by mouth every 6 (six) hours as needed. 50 tablet 0   lisinopril (ZESTRIL) 5 MG tablet Take 5 mg by mouth daily.     metoprolol succinate (TOPROL-XL) 25 MG 24 hr tablet Take 25 mg by mouth daily.     traZODone (DESYREL) 50 MG tablet TAKE 1 TABLET BY MOUTH NIGHTLY AT BEDTIME AS NEEDED FOR SLEEP 30 tablet 2   TRULICITY A999333 0000000 SOPN Inject 1.5 mg into the skin once a week.     acarbose (PRECOSE) 100 MG tablet Take 100 mg by mouth 3 (three) times daily.     benzonatate (TESSALON) 100 MG capsule Take 100 mg by mouth every 8 (eight) hours as needed for cough. (Patient not taking: Reported on 09/22/2022)     Cholecalciferol (DIALYVITE VITAMIN D 5000) 125 MCG (5000 UT) capsule Take 5,000 Units by mouth daily. (Patient not taking: Reported on 09/22/2022)     clopidogrel (PLAVIX) 75 MG tablet Take 1 tablet (75 mg total) by mouth daily. 90 tablet 2   Continuous Blood Gluc Sensor (FREESTYLE LIBRE 2 SENSOR) MISC USE EVERY 2 WEEKS WITH FREESTYLE LIBRE GLUCOMETER SYSTEM for 28     insulin degludec (TRESIBA FLEXTOUCH) 100 UNIT/ML FlexTouch Pen Inject 50 Units into the skin at bedtime. (Patient not taking: Reported on 09/22/2022)     levocetirizine (XYZAL) 5 MG tablet Take 5 mg by mouth at bedtime as needed for allergies. (Patient not taking: Reported on 09/22/2022)     zolpidem (AMBIEN) 5 MG tablet Take 5 mg by mouth at bedtime. (Patient not taking: Reported on 09/22/2022)     No current facility-administered medications for this visit.    Allergies:   Metoprolol tartrate    Social History:   reports that she has never smoked. She has never used smokeless tobacco. She reports that she  does not drink alcohol and does not use drugs.   Family History:  family history includes Breast cancer (age of onset: 65) in her maternal grandmother.    ROS:     Review of Systems  Constitutional: Negative.   HENT: Negative.    Eyes: Negative.   Respiratory: Negative.    Gastrointestinal: Negative.   Genitourinary: Negative.   Musculoskeletal: Negative.   Skin: Negative.  Neurological: Negative.   Endo/Heme/Allergies: Negative.   Psychiatric/Behavioral: Negative.    All other systems reviewed and are negative.     All other systems are reviewed and negative.    PHYSICAL EXAM: VS:  BP 130/70   Pulse 78   Ht '5\' 3"'$  (1.6 m)   Wt 141 lb 3.2 oz (64 kg)   SpO2 98%   BMI 25.01 kg/m  , BMI Body mass index is 25.01 kg/m. Last weight:  Wt Readings from Last 3 Encounters:  09/22/22 141 lb 3.2 oz (64 kg)  07/03/21 160 lb 15.7 oz (73 kg)  06/26/21 161 lb (73 kg)     Physical Exam Constitutional:      Appearance: Normal appearance.  Cardiovascular:     Rate and Rhythm: Normal rate and regular rhythm.     Heart sounds: Normal heart sounds.  Pulmonary:     Effort: Pulmonary effort is normal.     Breath sounds: Normal breath sounds.  Musculoskeletal:     Right lower leg: No edema.     Left lower leg: No edema.  Neurological:     Mental Status: She is alert.       EKG:   Recent Labs: No results found for requested labs within last 365 days.    Lipid Panel No results found for: "CHOL", "TRIG", "HDL", "CHOLHDL", "VLDL", "LDLCALC", "LDLDIRECT"    Other studies Reviewed: Additional studies/ records that were reviewed today include:  Review of the above records demonstrates:       No data to display            ASSESSMENT AND PLAN:    ICD-10-CM   1. Coronary artery disease involving other coronary artery bypass graft with other forms of angina pectoris ([place])  I25.798 PCV ECHOCARDIOGRAM COMPLETE    MYOCARDIAL PERFUSION IMAGING   doing well but had no  testing for 4 years, thus wiull do echo/stress test    2. Mixed hyperlipidemia  E78.2 PCV ECHOCARDIOGRAM COMPLETE    MYOCARDIAL PERFUSION IMAGING    3. Nonrheumatic mitral valve regurgitation  I34.0 PCV ECHOCARDIOGRAM COMPLETE    MYOCARDIAL PERFUSION IMAGING    4. 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 Primary hypertension  I10 PCV ECHOCARDIOGRAM COMPLETE    MYOCARDIAL PERFUSION IMAGING       Problem List Items Addressed This Visit   None Visit Diagnoses     Coronary artery disease involving other coronary artery bypass graft with other forms of angina pectoris ([place])    -  Primary   doing well but had no testing for 4 years, thus wiull do echo/stress test   Relevant Orders   PCV ECHOCARDIOGRAM COMPLETE   MYOCARDIAL PERFUSION IMAGING   Mixed hyperlipidemia       Relevant Orders   PCV ECHOCARDIOGRAM COMPLETE   MYOCARDIAL PERFUSION IMAGING   Nonrheumatic mitral valve regurgitation       Relevant Orders   PCV ECHOCARDIOGRAM COMPLETE   MYOCARDIAL PERFUSION IMAGING   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    Total time spent: 30 minutes  Signed,  Neoma Laming, MD  09/22/2022 10:09 AM    [place]

## 2022-09-23 ENCOUNTER — Other Ambulatory Visit: Payer: Self-pay

## 2022-09-23 DIAGNOSIS — I340 Nonrheumatic mitral (valve) insufficiency: Secondary | ICD-10-CM

## 2022-09-23 DIAGNOSIS — E782 Mixed hyperlipidemia: Secondary | ICD-10-CM

## 2022-09-23 MED ORDER — TRULICITY 0.75 MG/0.5ML ~~LOC~~ SOAJ: 1.5000 mg | SUBCUTANEOUS | 3 refills | Status: DC

## 2022-09-23 MED ORDER — EMPAGLIFLOZIN 25 MG PO TABS: 25.0000 mg | ORAL_TABLET | Freq: Every day | ORAL | 3 refills | Status: DC

## 2022-09-23 MED ORDER — CITALOPRAM HYDROBROMIDE 40 MG PO TABS: 40.0000 mg | ORAL_TABLET | Freq: Every day | ORAL | 3 refills | Status: DC

## 2022-09-23 MED ORDER — LISINOPRIL 5 MG PO TABS: 5.0000 mg | ORAL_TABLET | Freq: Every day | ORAL | 3 refills | Status: DC

## 2022-09-23 MED ORDER — ACARBOSE 100 MG PO TABS: 100.0000 mg | ORAL_TABLET | Freq: Three times a day (TID) | ORAL | 3 refills | Status: DC

## 2022-09-23 MED ORDER — TRAZODONE HCL 50 MG PO TABS: ORAL_TABLET | ORAL | 2 refills | Status: DC

## 2022-09-23 MED ORDER — GLIPIZIDE ER 10 MG PO TB24: 10.0000 mg | ORAL_TABLET | Freq: Every day | ORAL | 3 refills | Status: DC

## 2022-09-24 MED ORDER — METOPROLOL SUCCINATE ER 25 MG PO TB24: 25.0000 mg | ORAL_TABLET | Freq: Every day | ORAL | 0 refills | Status: DC

## 2022-09-24 MED ORDER — EZETIMIBE 10 MG PO TABS: 10.0000 mg | ORAL_TABLET | Freq: Every day | ORAL | 0 refills | Status: DC

## 2022-09-29 ENCOUNTER — Encounter: Payer: Self-pay | Admitting: Nurse Practitioner

## 2022-09-30 ENCOUNTER — Emergency Department: Payer: Medicaid Other

## 2022-09-30 ENCOUNTER — Emergency Department
Admission: EM | Admit: 2022-09-30 | Discharge: 2022-09-30 | Disposition: A | Discharge status: Home / Self Care | Payer: Medicaid Other | Attending: Emergency Medicine | Admitting: Emergency Medicine

## 2022-09-30 ENCOUNTER — Other Ambulatory Visit: Payer: Self-pay

## 2022-09-30 DIAGNOSIS — S39012A Strain of muscle, fascia and tendon of lower back, initial encounter: Secondary | ICD-10-CM | POA: Diagnosis not present

## 2022-09-30 DIAGNOSIS — Z7982 Long term (current) use of aspirin: Secondary | ICD-10-CM | POA: Insufficient documentation

## 2022-09-30 DIAGNOSIS — M545 Low back pain, unspecified: Secondary | ICD-10-CM | POA: Diagnosis not present

## 2022-09-30 DIAGNOSIS — Z794 Long term (current) use of insulin: Secondary | ICD-10-CM | POA: Diagnosis not present

## 2022-09-30 DIAGNOSIS — Y92410 Unspecified street and highway as the place of occurrence of the external cause: Secondary | ICD-10-CM | POA: Insufficient documentation

## 2022-09-30 DIAGNOSIS — Z7902 Long term (current) use of antithrombotics/antiplatelets: Secondary | ICD-10-CM | POA: Diagnosis not present

## 2022-09-30 MED ORDER — NAPROXEN 500 MG PO TABS
500.0000 mg | ORAL_TABLET | Freq: Once | ORAL | Status: AC
  Administered 2022-09-30: 500 mg via ORAL
  Filled 2022-09-30: qty 1

## 2022-09-30 MED ORDER — CYCLOBENZAPRINE HCL 5 MG PO TABS: 5.0000 mg | ORAL_TABLET | Freq: Three times a day (TID) | ORAL | 0 refills | Status: DC | PRN

## 2022-09-30 MED ORDER — NAPROXEN 500 MG PO TABS: 500.0000 mg | ORAL_TABLET | Freq: Two times a day (BID) | ORAL | 0 refills | Status: DC
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
## 2022-09-30 NOTE — Discharge Instructions (Signed)
Please take naproxen and Tylenol as needed for mild to moderate discomfort.  You may use Flexeril as needed for muscle tension/spasms.  Apply ice 20 minutes every hour over the next 2 to 3 days and then you may transition to heat.  Perform gentle stretching exercises and follow-up as needed for any worsening symptoms or any urgent changes in health.

## 2022-10-01 ENCOUNTER — Ambulatory Visit (INDEPENDENT_AMBULATORY_CARE_PROVIDER_SITE_OTHER): Payer: 59

## 2022-10-01 ENCOUNTER — Encounter: Payer: Self-pay | Admitting: Nurse Practitioner

## 2022-10-01 DIAGNOSIS — E782 Mixed hyperlipidemia: Secondary | ICD-10-CM

## 2022-10-01 DIAGNOSIS — I351 Nonrheumatic aortic (valve) insufficiency: Secondary | ICD-10-CM

## 2022-10-01 DIAGNOSIS — I340 Nonrheumatic mitral (valve) insufficiency: Secondary | ICD-10-CM | POA: Diagnosis not present

## 2022-10-01 DIAGNOSIS — I361 Nonrheumatic tricuspid (valve) insufficiency: Secondary | ICD-10-CM

## 2022-10-01 DIAGNOSIS — E113531 Type 2 diabetes mellitus with proliferative diabetic retinopathy with traction retinal detachment not involving the macula, right eye: Secondary | ICD-10-CM

## 2022-10-01 DIAGNOSIS — I10 Essential (primary) hypertension: Secondary | ICD-10-CM

## 2022-10-01 DIAGNOSIS — I25798 Atherosclerosis of other coronary artery bypass graft(s) with other forms of angina pectoris: Secondary | ICD-10-CM

## 2022-10-05 ENCOUNTER — Ambulatory Visit (INDEPENDENT_AMBULATORY_CARE_PROVIDER_SITE_OTHER): Payer: Medicaid Other

## 2022-10-05 DIAGNOSIS — I25798 Atherosclerosis of other coronary artery bypass graft(s) with other forms of angina pectoris: Secondary | ICD-10-CM | POA: Diagnosis not present

## 2022-10-05 DIAGNOSIS — I340 Nonrheumatic mitral (valve) insufficiency: Secondary | ICD-10-CM

## 2022-10-05 DIAGNOSIS — E113531 Type 2 diabetes mellitus with proliferative diabetic retinopathy with traction retinal detachment not involving the macula, right eye: Secondary | ICD-10-CM

## 2022-10-05 DIAGNOSIS — I10 Essential (primary) hypertension: Secondary | ICD-10-CM

## 2022-10-05 DIAGNOSIS — E782 Mixed hyperlipidemia: Secondary | ICD-10-CM

## 2022-10-05 MED ORDER — TECHNETIUM TC 99M SESTAMIBI GENERIC - CARDIOLITE
33.1000 | Freq: Once | INTRAVENOUS | Status: AC | PRN
  Administered 2022-10-05: 33.1 via INTRAVENOUS

## 2022-10-05 MED ORDER — TECHNETIUM TC 99M SESTAMIBI GENERIC - CARDIOLITE
10.8000 | Freq: Once | INTRAVENOUS | Status: AC | PRN
  Administered 2022-10-05: 10.8 via INTRAVENOUS

## 2022-10-08 ENCOUNTER — Encounter: Payer: Self-pay | Admitting: Cardiovascular Disease

## 2022-10-08 ENCOUNTER — Ambulatory Visit: Payer: 59 | Admitting: Cardiovascular Disease

## 2022-10-08 VITALS — BP 100/72 | HR 70 | Ht 63.0 in | Wt 144.2 lb

## 2022-10-08 DIAGNOSIS — I10 Essential (primary) hypertension: Secondary | ICD-10-CM

## 2022-10-08 DIAGNOSIS — I25710 Atherosclerosis of autologous vein coronary artery bypass graft(s) with unstable angina pectoris: Secondary | ICD-10-CM | POA: Diagnosis not present

## 2022-10-08 DIAGNOSIS — R0789 Other chest pain: Secondary | ICD-10-CM | POA: Diagnosis not present

## 2022-10-08 DIAGNOSIS — E782 Mixed hyperlipidemia: Secondary | ICD-10-CM | POA: Diagnosis not present
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
## 2022-10-20 ENCOUNTER — Other Ambulatory Visit: Payer: Self-pay | Admitting: Nurse Practitioner

## 2022-10-20 DIAGNOSIS — M79641 Pain in right hand: Secondary | ICD-10-CM

## 2022-10-23 ENCOUNTER — Encounter: Payer: Self-pay | Admitting: Nurse Practitioner

## 2022-10-30 ENCOUNTER — Ambulatory Visit
Admission: RE | Admit: 2022-10-30 | Discharge: 2022-10-30 | Disposition: A | Discharge status: Home / Self Care | Payer: 59 | Source: Ambulatory Visit | Attending: Nurse Practitioner | Admitting: Nurse Practitioner

## 2022-10-30 ENCOUNTER — Other Ambulatory Visit: Payer: Medicaid Other

## 2022-10-30 DIAGNOSIS — Z1231 Encounter for screening mammogram for malignant neoplasm of breast: Secondary | ICD-10-CM | POA: Insufficient documentation

## 2022-11-03 ENCOUNTER — Other Ambulatory Visit: Payer: Self-pay | Admitting: Nurse Practitioner

## 2022-11-03 DIAGNOSIS — R928 Other abnormal and inconclusive findings on diagnostic imaging of breast: Secondary | ICD-10-CM

## 2022-11-03 DIAGNOSIS — N6489 Other specified disorders of breast: Secondary | ICD-10-CM

## 2022-11-13 ENCOUNTER — Other Ambulatory Visit: Payer: Self-pay

## 2022-11-25 ENCOUNTER — Ambulatory Visit
Admission: RE | Admit: 2022-11-25 | Discharge: 2022-11-25 | Disposition: A | Discharge status: Home / Self Care | Payer: 59 | Source: Ambulatory Visit | Attending: Nurse Practitioner | Admitting: Nurse Practitioner

## 2022-11-25 DIAGNOSIS — N6489 Other specified disorders of breast: Secondary | ICD-10-CM | POA: Diagnosis not present

## 2022-11-25 DIAGNOSIS — R928 Other abnormal and inconclusive findings on diagnostic imaging of breast: Secondary | ICD-10-CM | POA: Insufficient documentation

## 2022-11-26 ENCOUNTER — Other Ambulatory Visit: Payer: Self-pay | Admitting: Nurse Practitioner

## 2022-11-26 DIAGNOSIS — R928 Other abnormal and inconclusive findings on diagnostic imaging of breast: Secondary | ICD-10-CM

## 2022-12-03 ENCOUNTER — Other Ambulatory Visit: Payer: 59

## 2022-12-03 ENCOUNTER — Other Ambulatory Visit: Payer: Self-pay | Admitting: Nurse Practitioner

## 2022-12-03 DIAGNOSIS — I10 Essential (primary) hypertension: Secondary | ICD-10-CM | POA: Diagnosis not present

## 2022-12-03 DIAGNOSIS — E782 Mixed hyperlipidemia: Secondary | ICD-10-CM | POA: Diagnosis not present

## 2022-12-03 DIAGNOSIS — E1165 Type 2 diabetes mellitus with hyperglycemia: Secondary | ICD-10-CM | POA: Diagnosis not present

## 2022-12-04 ENCOUNTER — Ambulatory Visit: Payer: Medicaid Other | Admitting: Nurse Practitioner

## 2022-12-04 LAB — LIPID PANEL W/O CHOL/HDL RATIO
Cholesterol, Total: 124 mg/dL (ref 100–199)
HDL: 31 mg/dL — ABNORMAL LOW (ref >39)
LDL Chol Calc (NIH): 67 mg/dL (ref 0–99)
Triglycerides: 149 mg/dL (ref 0–149)
VLDL Cholesterol Cal: 26 mg/dL (ref 5–40)

## 2022-12-04 LAB — COMPREHENSIVE METABOLIC PANEL
ALT: 50 IU/L — ABNORMAL HIGH (ref 0–32)
AST: 27 IU/L (ref 0–40)
Albumin/Globulin Ratio: 1.8 (ref 1.2–2.2)
Albumin: 4.4 g/dL (ref 3.8–4.9)
Alkaline Phosphatase: 80 IU/L (ref 44–121)
BUN/Creatinine Ratio: 26 — ABNORMAL HIGH (ref 9–23)
BUN: 21 mg/dL (ref 6–24)
Bilirubin Total: 0.4 mg/dL (ref 0.0–1.2)
CO2: 21 mmol/L (ref 20–29)
Calcium: 9.5 mg/dL (ref 8.7–10.2)
Chloride: 103 mmol/L (ref 96–106)
Creatinine, Ser: 0.81 mg/dL (ref 0.57–1.00)
Globulin, Total: 2.4 g/dL (ref 1.5–4.5)
Glucose: 326 mg/dL — ABNORMAL HIGH (ref 70–99)
Potassium: 5.1 mmol/L (ref 3.5–5.2)
Sodium: 139 mmol/L (ref 134–144)
Total Protein: 6.8 g/dL (ref 6.0–8.5)
eGFR: 85 mL/min/{1.73_m2} (ref >59)

## 2022-12-04 LAB — TSH: TSH: 2.34 u[IU]/mL (ref 0.450–4.500)

## 2022-12-04 LAB — HGB A1C W/O EAG: Hgb A1c MFr Bld: 12 % — ABNORMAL HIGH (ref 4.8–5.6)

## 2022-12-07 ENCOUNTER — Ambulatory Visit: Payer: Medicaid Other | Admitting: Nurse Practitioner

## 2022-12-14 ENCOUNTER — Encounter: Payer: Self-pay | Admitting: Nurse Practitioner

## 2022-12-14 ENCOUNTER — Ambulatory Visit: Payer: 59 | Admitting: Nurse Practitioner

## 2022-12-14 VITALS — BP 120/70 | HR 63 | Ht 63.0 in | Wt 145.0 lb

## 2022-12-14 DIAGNOSIS — M1990 Unspecified osteoarthritis, unspecified site: Secondary | ICD-10-CM | POA: Diagnosis not present

## 2022-12-14 DIAGNOSIS — E119 Type 2 diabetes mellitus without complications: Secondary | ICD-10-CM

## 2022-12-14 DIAGNOSIS — M79644 Pain in right finger(s): Secondary | ICD-10-CM | POA: Diagnosis not present

## 2022-12-14 DIAGNOSIS — E782 Mixed hyperlipidemia: Secondary | ICD-10-CM | POA: Diagnosis not present

## 2022-12-14 DIAGNOSIS — E1165 Type 2 diabetes mellitus with hyperglycemia: Secondary | ICD-10-CM | POA: Insufficient documentation

## 2022-12-14 NOTE — Progress Notes (Signed)
Established Patient Office Visit  Subjective:  Patient ID: Leah Hinton, female    DOB: [birth year]  Age: 57 y.o. MRN: [id number]  Chief Complaint  Patient presents with   Follow-up    4 month lab results    Patient here today for fasting labs review, A1c 12 %.  Feeling well.  Did not have Trulicity for 1 month due to pharmacy not filling it appropriately.      No other concerns at this time.   Past Medical History:  Diagnosis Date   Anxiety    Coronary artery disease    Depression    Diabetes mellitus without complication (HCC)    Hypertension    Myocardial infarction (HCC)    PONV (postoperative nausea and vomiting)     Past Surgical History:  Procedure Laterality Date   ABDOMINAL HYSTERECTOMY  2012   CESAREAN SECTION     x 2   CORONARY ARTERY BYPASS GRAFT     GANGLION CYST EXCISION Left    TRIGGER FINGER RELEASE Left 07/03/2021   Procedure: RELEASE TRIGGER FINGER/A-1 PULLEY -  INCISION, TENDON SHEATH (SURG);  Surgeon: Deeann Saint, MD;  Location: [facility] ORS;  Service: Orthopedics;  Laterality: Left;    Social History   Socioeconomic History   Marital status: Widowed    Spouse name: Not on file   Number of children: Not on file   Years of education: Not on file   Highest education level: Not on file  Occupational History   Not on file  Tobacco Use   Smoking status: Never    Passive exposure: Never   Smokeless tobacco: Never  Vaping Use   Vaping Use: Never used  Substance and Sexual Activity   Alcohol use: Never   Drug use: Never   Sexual activity: Not on file  Other Topics Concern   Not on file  Social History Narrative   Lives with mother and step dad.   Social Determinants of Health   Financial Resource Strain: Not on file  Food Insecurity: Not on file  Transportation Needs: Not on file  Physical Activity: Not on file  Stress: Not on file  Social Connections: Not on file  Intimate Partner Violence: Not on file    Family History   Problem Relation Age of Onset   Breast cancer Maternal Grandmother 76    Allergies  Allergen Reactions   Metoprolol Tartrate     Headaches, tolerates metoprolol succinate     Review of Systems  Constitutional: Negative.   HENT: Negative.    Eyes: Negative.   Respiratory: Negative.    Cardiovascular: Negative.   Gastrointestinal: Negative.   Genitourinary: Negative.   Musculoskeletal:  Positive for joint pain.  Skin: Negative.   Neurological: Negative.   Endo/Heme/Allergies: Negative.   Psychiatric/Behavioral: Negative.         Objective:   BP 120/70   Pulse 63   Ht 5\' 3"  (1.6 m)   Wt 145 lb (65.8 kg)   SpO2 97%   BMI 25.69 kg/m   Vitals:   12/14/22 1303  BP: 120/70  Pulse: 63  Height: 5\' 3"  (1.6 m)  Weight: 145 lb (65.8 kg)  SpO2: 97%  BMI (Calculated): 25.69    Physical Exam Vitals and nursing note reviewed.  Constitutional:      Appearance: Normal appearance.  HENT:     Head: Normocephalic.     Nose: Nose normal.     Mouth/Throat:     Mouth: Mucous membranes are  moist.  Eyes:     Pupils: Pupils are equal, round, and reactive to light.  Cardiovascular:     Rate and Rhythm: Normal rate and regular rhythm.  Pulmonary:     Effort: Pulmonary effort is normal.     Breath sounds: Normal breath sounds.  Abdominal:     General: Bowel sounds are normal.     Palpations: Abdomen is soft.  Musculoskeletal:        General: Swelling and tenderness present.     Cervical back: Normal range of motion and neck supple.  Skin:    General: Skin is warm and dry.  Neurological:     Mental Status: She is alert and oriented to person, place, and time.  Psychiatric:        Mood and Affect: Mood normal.        Behavior: Behavior normal.      No results found for any visits on 12/14/22.  Recent Results (from the past 2160 hour(s))  Comprehensive metabolic panel     Status: Abnormal   Collection Time: 12/03/22  9:06 AM  Result Value Ref Range   Glucose 326  (H) 70 - 99 mg/dL   BUN 21 6 - 24 mg/dL   Creatinine, Ser 1.61 0.57 - 1.00 mg/dL   eGFR 85 >09 UE/AVW/0.98   BUN/Creatinine Ratio 26 (H) 9 - 23   Sodium 139 134 - 144 mmol/L   Potassium 5.1 3.5 - 5.2 mmol/L   Chloride 103 96 - 106 mmol/L   CO2 21 20 - 29 mmol/L   Calcium 9.5 8.7 - 10.2 mg/dL   Total Protein 6.8 6.0 - 8.5 g/dL   Albumin 4.4 3.8 - 4.9 g/dL   Globulin, Total 2.4 1.5 - 4.5 g/dL   Albumin/Globulin Ratio 1.8 1.2 - 2.2   Bilirubin Total 0.4 0.0 - 1.2 mg/dL   Alkaline Phosphatase 80 44 - 121 IU/L   AST 27 0 - 40 IU/L   ALT 50 (H) 0 - 32 IU/L  Lipid Panel w/o Chol/HDL Ratio     Status: Abnormal   Collection Time: 12/03/22  9:06 AM  Result Value Ref Range   Cholesterol, Total 124 100 - 199 mg/dL   Triglycerides 119 0 - 149 mg/dL   HDL 31 (L) >14 mg/dL   VLDL Cholesterol Cal 26 5 - 40 mg/dL   LDL Chol Calc (NIH) 67 0 - 99 mg/dL  Hgb N8G w/o eAG     Status: Abnormal   Collection Time: 12/03/22  9:06 AM  Result Value Ref Range   Hgb A1c MFr Bld 12.0 (H) 4.8 - 5.6 %    Comment:          Prediabetes: 5.7 - 6.4          Diabetes: >6.4          Glycemic control for adults with diabetes: <7.0   TSH     Status: None   Collection Time: 12/03/22  9:06 AM  Result Value Ref Range   TSH 2.340 0.450 - 4.500 uIU/mL      Assessment & Plan:  1) Follow up appt in 3 months, 2 weeks, fasting labs prior Problem List Items Addressed This Visit       Endocrine   Diabetes mellitus without complication (HCC)     Musculoskeletal and Integument   Arthritis     Other   Pain in right finger(s) - Primary   Relevant Orders   DG Finger Index Right  Mixed hyperlipidemia    No follow-ups on file.   Total time spent: 35 minutes  Orson Eva, NP  12/14/2022   This document may have been prepared by [facility] Voice Recognition software and as such may include unintentional dictation errors.

## 2022-12-15 ENCOUNTER — Ambulatory Visit (INDEPENDENT_AMBULATORY_CARE_PROVIDER_SITE_OTHER): Payer: Medicaid Other

## 2022-12-15 DIAGNOSIS — M79644 Pain in right finger(s): Secondary | ICD-10-CM | POA: Diagnosis not present

## 2022-12-18 ENCOUNTER — Other Ambulatory Visit: Payer: Self-pay | Admitting: Cardiovascular Disease

## 2022-12-18 DIAGNOSIS — E782 Mixed hyperlipidemia: Secondary | ICD-10-CM

## 2022-12-20 ENCOUNTER — Other Ambulatory Visit: Payer: Self-pay | Admitting: Cardiovascular Disease

## 2022-12-20 DIAGNOSIS — I340 Nonrheumatic mitral (valve) insufficiency: Secondary | ICD-10-CM

## 2022-12-21 ENCOUNTER — Other Ambulatory Visit: Payer: Self-pay | Admitting: Nurse Practitioner

## 2022-12-22 ENCOUNTER — Encounter: Payer: Self-pay | Admitting: Nurse Practitioner

## 2022-12-25 ENCOUNTER — Encounter: Payer: Self-pay | Admitting: Internal Medicine

## 2022-12-25 ENCOUNTER — Ambulatory Visit: Payer: Medicaid Other | Admitting: Internal Medicine

## 2022-12-25 VITALS — BP 118/68 | HR 57 | Ht 63.0 in | Wt 144.0 lb

## 2022-12-25 DIAGNOSIS — I25798 Atherosclerosis of other coronary artery bypass graft(s) with other forms of angina pectoris: Secondary | ICD-10-CM

## 2022-12-25 DIAGNOSIS — M62838 Other muscle spasm: Secondary | ICD-10-CM | POA: Diagnosis not present

## 2022-12-25 DIAGNOSIS — I10 Essential (primary) hypertension: Secondary | ICD-10-CM | POA: Insufficient documentation

## 2022-12-25 DIAGNOSIS — E782 Mixed hyperlipidemia: Secondary | ICD-10-CM | POA: Diagnosis not present

## 2022-12-25 DIAGNOSIS — I2510 Atherosclerotic heart disease of native coronary artery without angina pectoris: Secondary | ICD-10-CM | POA: Insufficient documentation

## 2022-12-25 MED ORDER — BACLOFEN 10 MG PO TABS: 10.0000 mg | ORAL_TABLET | Freq: Every day | ORAL | 0 refills | Status: AC

## 2022-12-25 MED ORDER — CELECOXIB 200 MG PO CAPS: 200.0000 mg | ORAL_CAPSULE | Freq: Every day | ORAL | 0 refills | Status: AC

## 2022-12-25 NOTE — Progress Notes (Signed)
Established Patient Office Visit  Subjective:  Patient ID: Leah Hinton, female    DOB: Mar 15, 1966  Age: 57 y.o. MRN: [id number]  Chief Complaint  Patient presents with   Back Pain    Right sided neck pain radiating to back. Started a week or so ago.    Patient comes in with 2-week history of right-sided neck pain.  But pain starts from the mastoid process ,behind her right ear at the insertion of SCM tendon and goes down her neck . She also feels some pain in her Right upper shoulder.  She thought that it was due to her old pillow so she recently changed to a new pillow.  She does not have any tingling or numbness of her hands or arms and there is no radiation of pain down her upper arm. Patient does not complain of right-sided headaches, no sore throat, no ear pain.  Back Pain Pertinent negatives include no chest pain, fever, headaches, weakness or weight loss.    No other concerns at this time.   Past Medical History:  Diagnosis Date   Anxiety    Coronary artery disease    Depression    Diabetes mellitus without complication (HCC)    Hypertension    Myocardial infarction (HCC)    PONV (postoperative nausea and vomiting)     Past Surgical History:  Procedure Laterality Date   ABDOMINAL HYSTERECTOMY  2012   CESAREAN SECTION     x 2   CORONARY ARTERY BYPASS GRAFT     GANGLION CYST EXCISION Left    TRIGGER FINGER RELEASE Left 07/03/2021   Procedure: RELEASE TRIGGER FINGER/A-1 PULLEY -  INCISION, TENDON SHEATH (SURG);  Surgeon: Deeann Saint, MD;  Location: [facility] ORS;  Service: Orthopedics;  Laterality: Left;    Social History   Socioeconomic History   Marital status: Widowed    Spouse name: Not on file   Number of children: Not on file   Years of education: Not on file   Highest education level: Not on file  Occupational History   Not on file  Tobacco Use   Smoking status: Never    Passive exposure: Never   Smokeless tobacco: Never  Vaping Use   Vaping  Use: Never used  Substance and Sexual Activity   Alcohol use: Never   Drug use: Never   Sexual activity: Not on file  Other Topics Concern   Not on file  Social History Narrative   Lives with mother and step dad.   Social Determinants of Health   Financial Resource Strain: Not on file  Food Insecurity: Not on file  Transportation Needs: Not on file  Physical Activity: Not on file  Stress: Not on file  Social Connections: Not on file  Intimate Partner Violence: Not on file    Family History  Problem Relation Age of Onset   Breast cancer Maternal Grandmother 59    Allergies  Allergen Reactions   Semaglutide Nausea And Vomiting   Metoprolol Tartrate     Headaches, tolerates metoprolol succinate     Review of Systems  Constitutional:  Negative for chills, diaphoresis, fever, malaise/fatigue and weight loss.  HENT:  Negative for ear discharge, hearing loss, sinus pain, sore throat and tinnitus.   Eyes: Negative.   Respiratory:  Negative for cough, shortness of breath and wheezing.   Cardiovascular:  Negative for chest pain, orthopnea, claudication and PND.  Gastrointestinal:  Negative for diarrhea, heartburn, nausea and vomiting.  Genitourinary:  Negative for  flank pain, frequency, hematuria and urgency.  Musculoskeletal:  Positive for neck pain. Negative for back pain, falls, joint pain and myalgias.  Skin: Negative.   Neurological:  Negative for tremors, sensory change, speech change, seizures, loss of consciousness, weakness and headaches.  Psychiatric/Behavioral:  Negative for depression. The patient is not nervous/anxious.        Objective:   BP 118/68   Pulse (!) 57   Ht 5\' 3"  (1.6 m)   Wt 144 lb (65.3 kg)   SpO2 96%   BMI 25.51 kg/m   Vitals:   12/25/22 1045  BP: 118/68  Pulse: (!) 57  Height: 5\' 3"  (1.6 m)  Weight: 144 lb (65.3 kg)  SpO2: 96%  BMI (Calculated): 25.51    Physical Exam Nursing note reviewed.  Constitutional:      Appearance:  Normal appearance.  HENT:     Head: Normocephalic and atraumatic.     Right Ear: Ear canal and external ear normal.     Left Ear: Ear canal and external ear normal.     Mouth/Throat:     Mouth: Mucous membranes are moist.  Neck:     Vascular: No carotid bruit.  Cardiovascular:     Rate and Rhythm: Normal rate and regular rhythm.     Pulses: Normal pulses.     Heart sounds: Normal heart sounds.  Pulmonary:     Effort: Pulmonary effort is normal.     Breath sounds: No wheezing, rhonchi or rales.  Abdominal:     General: Bowel sounds are normal.     Palpations: Abdomen is soft.     Tenderness: There is no abdominal tenderness. There is no right CVA tenderness, left CVA tenderness or guarding.     Hernia: No hernia is present.  Musculoskeletal:        General: No swelling, deformity or signs of injury. Normal range of motion.     Cervical back: Normal range of motion. Tenderness present.     Right lower leg: No edema.     Left lower leg: No edema.  Lymphadenopathy:     Cervical: No cervical adenopathy.  Neurological:     General: No focal deficit present.     Mental Status: She is alert and oriented to person, place, and time.  Psychiatric:        Mood and Affect: Mood normal.        Behavior: Behavior normal.      No results found for any visits on 12/25/22.  Recent Results (from the past 2160 hour(s))  Comprehensive metabolic panel     Status: Abnormal   Collection Time: 12/03/22  9:06 AM  Result Value Ref Range   Glucose 326 (H) 70 - 99 mg/dL   BUN 21 6 - 24 mg/dL   Creatinine, Ser 1.61 0.57 - 1.00 mg/dL   eGFR 85 >09 UE/AVW/0.98   BUN/Creatinine Ratio 26 (H) 9 - 23   Sodium 139 134 - 144 mmol/L   Potassium 5.1 3.5 - 5.2 mmol/L   Chloride 103 96 - 106 mmol/L   CO2 21 20 - 29 mmol/L   Calcium 9.5 8.7 - 10.2 mg/dL   Total Protein 6.8 6.0 - 8.5 g/dL   Albumin 4.4 3.8 - 4.9 g/dL   Globulin, Total 2.4 1.5 - 4.5 g/dL   Albumin/Globulin Ratio 1.8 1.2 - 2.2    Bilirubin Total 0.4 0.0 - 1.2 mg/dL   Alkaline Phosphatase 80 44 - 121 IU/L   AST 27 0 -  40 IU/L   ALT 50 (H) 0 - 32 IU/L  Lipid Panel w/o Chol/HDL Ratio     Status: Abnormal   Collection Time: 12/03/22  9:06 AM  Result Value Ref Range   Cholesterol, Total 124 100 - 199 mg/dL   Triglycerides 161 0 - 149 mg/dL   HDL 31 (L) >09 mg/dL   VLDL Cholesterol Cal 26 5 - 40 mg/dL   LDL Chol Calc (NIH) 67 0 - 99 mg/dL  Hgb U0A w/o eAG     Status: Abnormal   Collection Time: 12/03/22  9:06 AM  Result Value Ref Range   Hgb A1c MFr Bld 12.0 (H) 4.8 - 5.6 %    Comment:          Prediabetes: 5.7 - 6.4          Diabetes: >6.4          Glycemic control for adults with diabetes: <7.0   TSH     Status: None   Collection Time: 12/03/22  9:06 AM  Result Value Ref Range   TSH 2.340 0.450 - 4.500 uIU/mL      Assessment & Plan:  Patient will start p.o. Celebrex once a day with food.  Also baclofen 10 mg once at bedtime or twice a day if she is not going driving.  Patient will continue to use Voltaren gel at the area.  If not better then can return sooner for visit. Problem List Items Addressed This Visit     Mixed hyperlipidemia   Neck muscle spasm - Primary   Relevant Medications   baclofen (LIORESAL) 10 MG tablet   celecoxib (CELEBREX) 200 MG capsule   Coronary artery disease   Essential hypertension, benign    No follow-ups on file.   Total time spent: 25 minutes  Margaretann Loveless, MD  12/25/2022   This document may have been prepared by [facility] Voice Recognition software and as such may include unintentional dictation errors.

## 2023-01-08 ENCOUNTER — Encounter: Payer: Self-pay | Admitting: Cardiovascular Disease

## 2023-01-08 ENCOUNTER — Ambulatory Visit: Payer: Medicaid Other | Admitting: Cardiovascular Disease

## 2023-01-08 VITALS — BP 100/70 | HR 51 | Ht 63.0 in | Wt 146.4 lb

## 2023-01-08 DIAGNOSIS — I25810 Atherosclerosis of coronary artery bypass graft(s) without angina pectoris: Secondary | ICD-10-CM

## 2023-01-08 DIAGNOSIS — E782 Mixed hyperlipidemia: Secondary | ICD-10-CM

## 2023-01-08 DIAGNOSIS — I10 Essential (primary) hypertension: Secondary | ICD-10-CM

## 2023-01-08 DIAGNOSIS — E119 Type 2 diabetes mellitus without complications: Secondary | ICD-10-CM

## 2023-01-08 NOTE — Progress Notes (Addendum)
Cardiology Office Note   Date:  01/08/2023   ID:  Leah Hinton, DOB 06-09-1966, MRN [id number]  PCP:  Orson Eva, NP  Cardiologist:  Adrian Blackwater, MD      History of Present Illness: Leah Hinton is a 57 y.o. female who presents for  Chief Complaint  Patient presents with   Follow-up    3 mo F/U    FEELING GOOD      Past Medical History:  Diagnosis Date   Anxiety    Coronary artery disease    Depression    Diabetes mellitus without complication (HCC)    Hypertension    Myocardial infarction (HCC)    PONV (postoperative nausea and vomiting)      Past Surgical History:  Procedure Laterality Date   ABDOMINAL HYSTERECTOMY  2012   CESAREAN SECTION     x 2   CORONARY ARTERY BYPASS GRAFT     GANGLION CYST EXCISION Left    TRIGGER FINGER RELEASE Left 07/03/2021   Procedure: RELEASE TRIGGER FINGER/A-1 PULLEY -  INCISION, TENDON SHEATH (SURG);  Surgeon: Deeann Saint, MD;  Location: [facility] ORS;  Service: Orthopedics;  Laterality: Left;     Current Outpatient Medications  Medication Sig Dispense Refill   acarbose (PRECOSE) 100 MG tablet Take 1 tablet (100 mg total) by mouth 3 (three) times daily. 270 tablet 3   acetaminophen (TYLENOL) 500 MG tablet Take 1,000 mg by mouth every 6 (six) hours as needed for moderate pain.     albuterol (VENTOLIN HFA) 108 (90 Base) MCG/ACT inhaler Inhale 1 puff into the lungs every 6 (six) hours as needed for shortness of breath.     aspirin EC 81 MG tablet Take 81 mg by mouth daily. Swallow whole.     atorvastatin (LIPITOR) 80 MG tablet TAKE 1 TABLET BY MOUTH EVERY DAY 90 tablet 0   baclofen (LIORESAL) 10 MG tablet Take 1 tablet (10 mg total) by mouth at bedtime. 30 tablet 0   celecoxib (CELEBREX) 200 MG capsule Take 1 capsule (200 mg total) by mouth daily. 30 capsule 0   Cholecalciferol (DIALYVITE VITAMIN D 5000) 125 MCG (5000 UT) capsule Take 5,000 Units by mouth daily.     citalopram (CELEXA) 40 MG tablet Take 1 tablet (40  mg total) by mouth at bedtime. 90 tablet 3   clopidogrel (PLAVIX) 75 MG tablet Take 1 tablet (75 mg total) by mouth daily. 90 tablet 2   Continuous Blood Gluc Sensor (FREESTYLE LIBRE 2 SENSOR) MISC USE EVERY 2 WEEKS WITH FREESTYLE LIBRE GLUCOMETER SYSTEM for 28     cyclobenzaprine (FLEXERIL) 5 MG tablet Take 1-2 tablets (5-10 mg total) by mouth 3 (three) times daily as needed for muscle spasms. 20 tablet 0   dapagliflozin propanediol (FARXIGA) 10 MG TABS tablet TAKE 1 TABLET BY MOUTH EVERY DAY 90 tablet 3   empagliflozin (JARDIANCE) 25 MG TABS tablet Take 1 tablet (25 mg total) by mouth daily. 90 tablet 3   ezetimibe (ZETIA) 10 MG tablet TAKE 1 TABLET(10 MG) BY MOUTH DAILY 90 tablet 0   glipiZIDE (GLUCOTROL XL) 10 MG 24 hr tablet Take 1 tablet (10 mg total) by mouth daily. 90 tablet 3   HYDROcodone-acetaminophen (NORCO) 5-325 MG tablet Take 1-2 tablets by mouth every 6 (six) hours as needed. (Patient not taking: Reported on 12/25/2022) 50 tablet 0   insulin degludec (TRESIBA FLEXTOUCH) 100 UNIT/ML FlexTouch Pen Inject 50 Units into the skin at bedtime.     levocetirizine (XYZAL)  5 MG tablet Take 5 mg by mouth at bedtime as needed for allergies.     lisinopril (ZESTRIL) 5 MG tablet Take 1 tablet (5 mg total) by mouth daily. 90 tablet 3   metoprolol succinate (TOPROL-XL) 25 MG 24 hr tablet TAKE 1 TABLET(25 MG) BY MOUTH DAILY 90 tablet 0   traZODone (DESYREL) 50 MG tablet TAKE 1 TABLET BY MOUTH EVERY NIGHT AT BEDTIME AS NEEDED FOR SLEEP 30 tablet 2   TRULICITY 0.75 MG/0.5ML SOPN Inject 1.5 mg into the skin once a week. 6 mL 3   zolpidem (AMBIEN) 5 MG tablet Take 5 mg by mouth at bedtime.     No current facility-administered medications for this visit.    Allergies:   Semaglutide and Metoprolol tartrate    Social History:   reports that she has never smoked. She has never been exposed to tobacco smoke. She has never used smokeless tobacco. She reports that she does not drink alcohol and does not use  drugs.   Family History:  family history includes Breast cancer (age of onset: 27) in her maternal grandmother.    ROS:     Review of Systems  Constitutional: Negative.   HENT: Negative.    Eyes: Negative.   Respiratory: Negative.    Gastrointestinal: Negative.   Genitourinary: Negative.   Musculoskeletal: Negative.   Skin: Negative.   Neurological: Negative.   Endo/Heme/Allergies: Negative.   Psychiatric/Behavioral: Negative.    All other systems reviewed and are negative.     All other systems are reviewed and negative.    PHYSICAL EXAM: VS:  BP 100/70   Pulse (!) 51   Ht 5\' 3"  (1.6 m)   Wt 146 lb 6.4 oz (66.4 kg)   SpO2 97%   BMI 25.93 kg/m  , BMI Body mass index is 25.93 kg/m. Last weight:  Wt Readings from Last 3 Encounters:  01/08/23 146 lb 6.4 oz (66.4 kg)  12/25/22 144 lb (65.3 kg)  12/14/22 145 lb (65.8 kg)     Physical Exam Constitutional:      Appearance: Normal appearance.  Cardiovascular:     Rate and Rhythm: Normal rate and regular rhythm.     Heart sounds: Normal heart sounds.  Pulmonary:     Effort: Pulmonary effort is normal.     Breath sounds: Normal breath sounds.  Musculoskeletal:     Right lower leg: No edema.     Left lower leg: No edema.  Neurological:     Mental Status: She is alert.       EKG:   Recent Labs: 12/03/2022: ALT 50; BUN 21; Creatinine, Ser 0.81; Potassium 5.1; Sodium 139; TSH 2.340    Lipid Panel    Component Value Date/Time   CHOL 124 12/03/2022 0906   TRIG 149 12/03/2022 0906   HDL 31 (L) 12/03/2022 0906   LDLCALC 67 12/03/2022 0906      Other studies Reviewed: Additional studies/ records that were reviewed today include:  Review of the above records demonstrates:       No data to display            ASSESSMENT AND PLAN:    ICD-10-CM   1. Coronary artery disease involving coronary bypass graft of native heart without angina pectoris  I25.810    STRESS TEST 3/24 EQUIVACAL, ASYMPTOMATIC     2. Essential hypertension, benign  I10    STABLE    3. Diabetes mellitus without complication (HCC)  E11.9  4. Mixed hyperlipidemia  E78.2    LDL 67       Problem List Items Addressed This Visit       Cardiovascular and Mediastinum   Coronary artery disease - Primary   Essential hypertension, benign     Endocrine   Diabetes mellitus without complication (HCC)     Other   Mixed hyperlipidemia       Disposition:   Return in about 3 months (around 04/10/2023).    Total time spent: 30 minutes  Signed,  Adrian Blackwater, MD  01/08/2023 9:59 AM    [facility]

## 2023-02-07 IMAGING — MG MM DIGITAL SCREENING BILAT W/ TOMO AND CAD
6 of 10 series · 6 of 30 positions shown · non-contrast
Comparison: Previous exam(s).

ACR Breast Density Category a: The breast tissue is almost entirely
fatty.

CLINICAL DATA: Screening.

EXAM:
DIGITAL SCREENING BILATERAL MAMMOGRAM WITH TOMOSYNTHESIS AND CAD
TECHNIQUE: Bilateral screening digital craniocaudal and mediolateral oblique
mammograms were obtained. Bilateral screening digital breast
tomosynthesis was performed. The images were evaluated with
computer-aided detection.

[L MLO synth-2D]
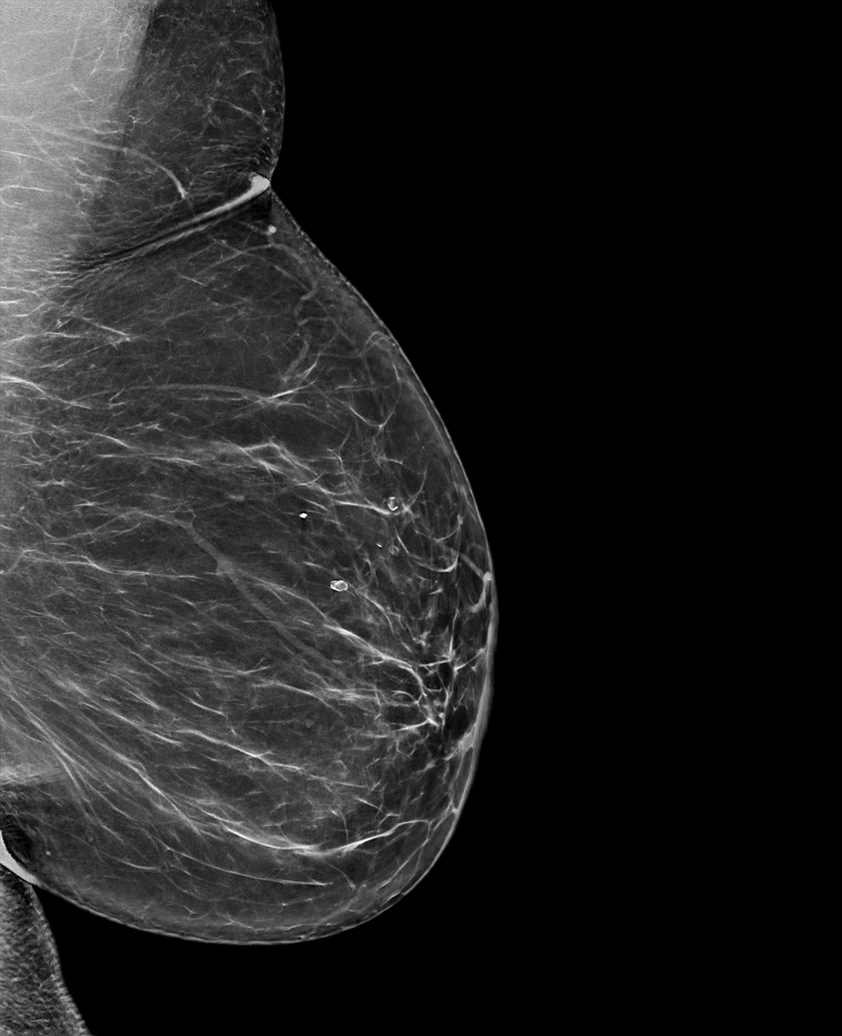

[R CC synth-2D]
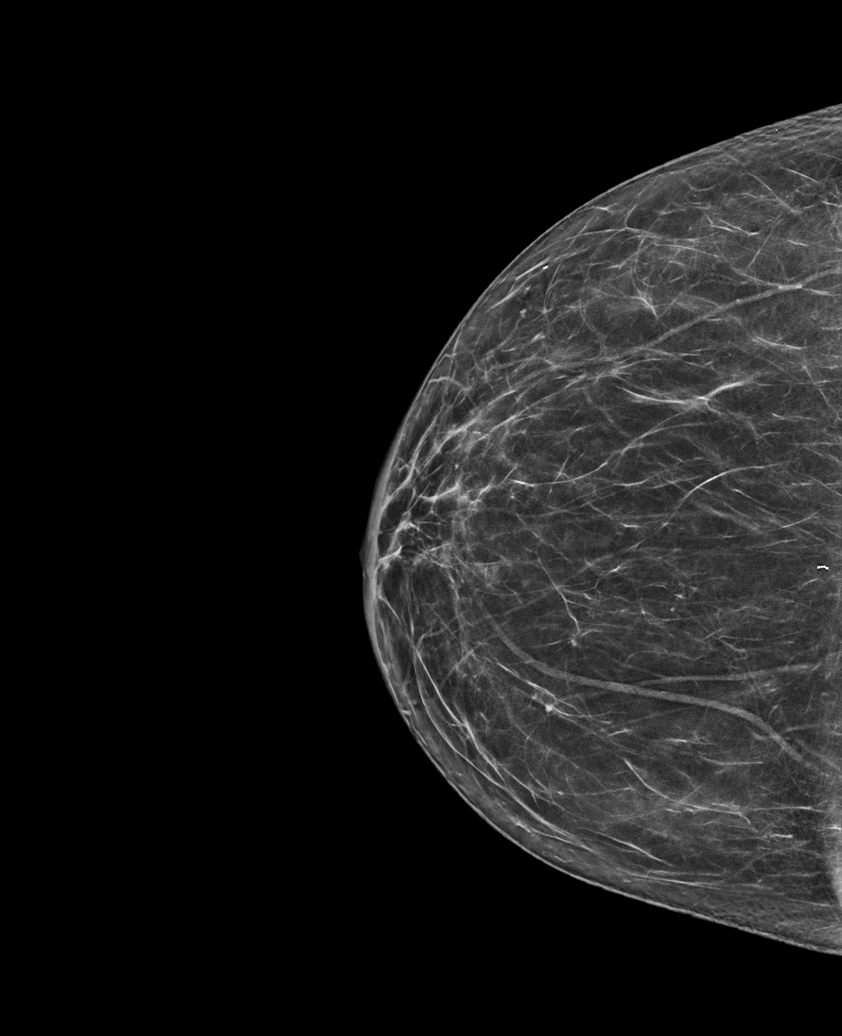

[L CC synth-2D]
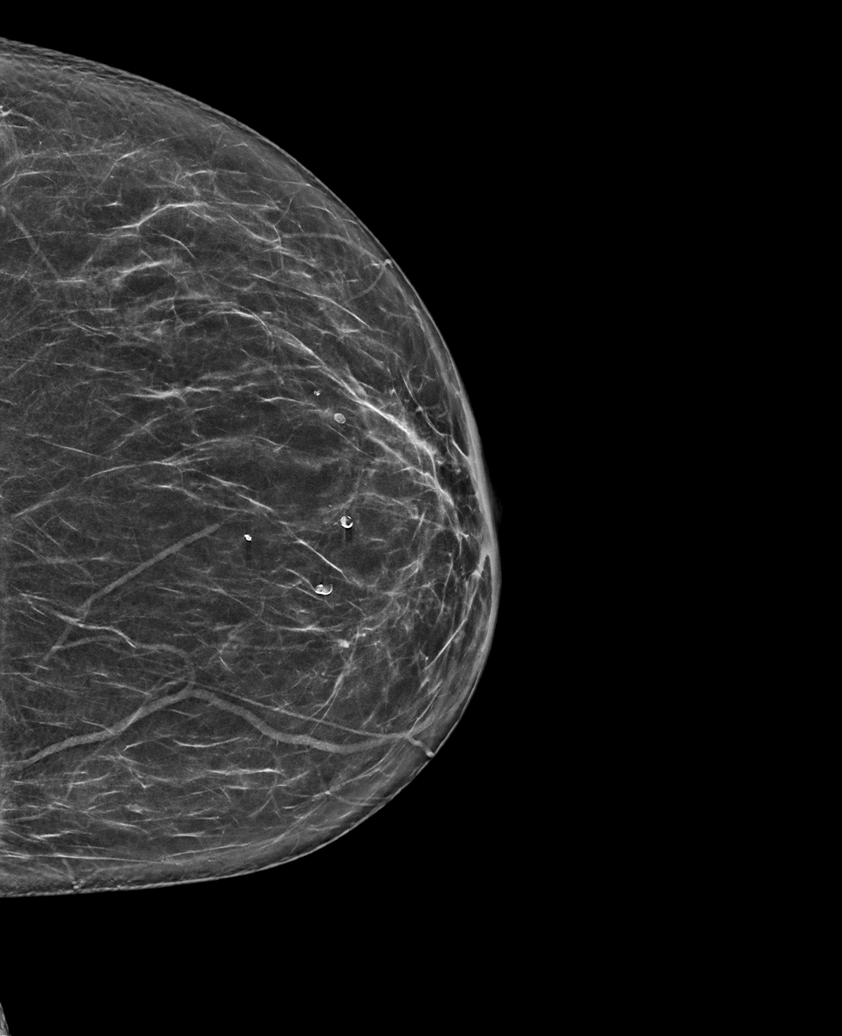

[R MLO synth-2D (1 of 2)]
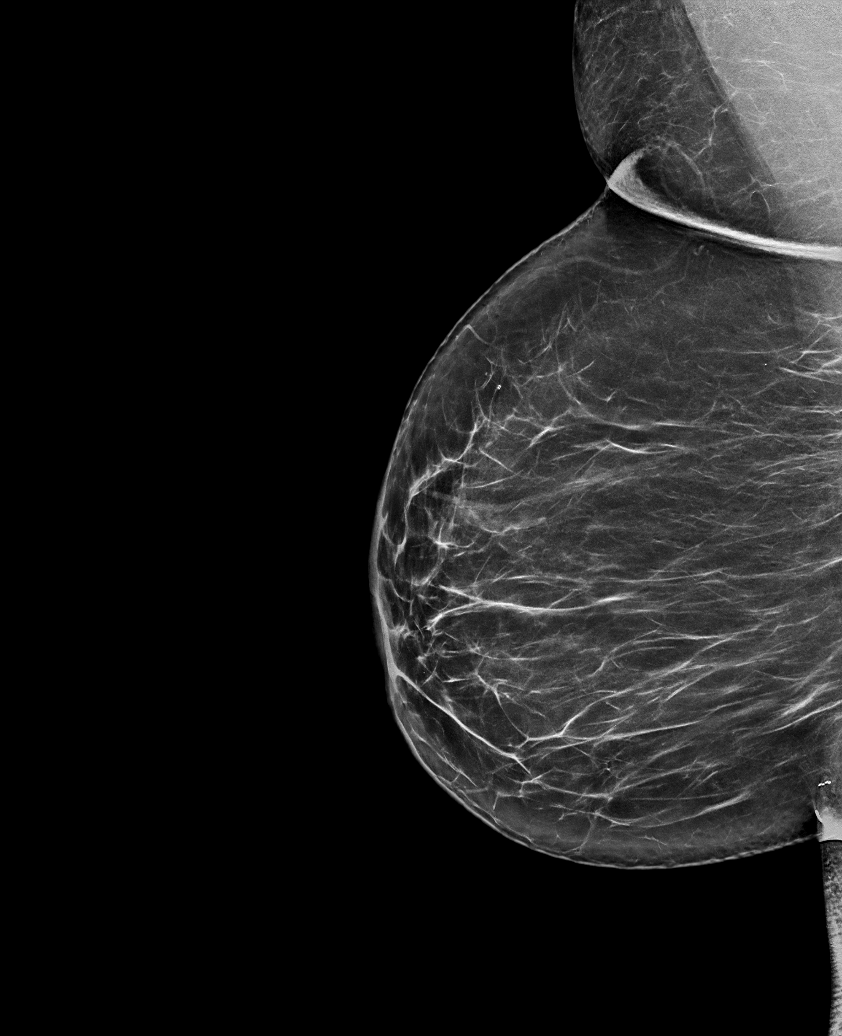

[R MLO synth-2D (2 of 2)]
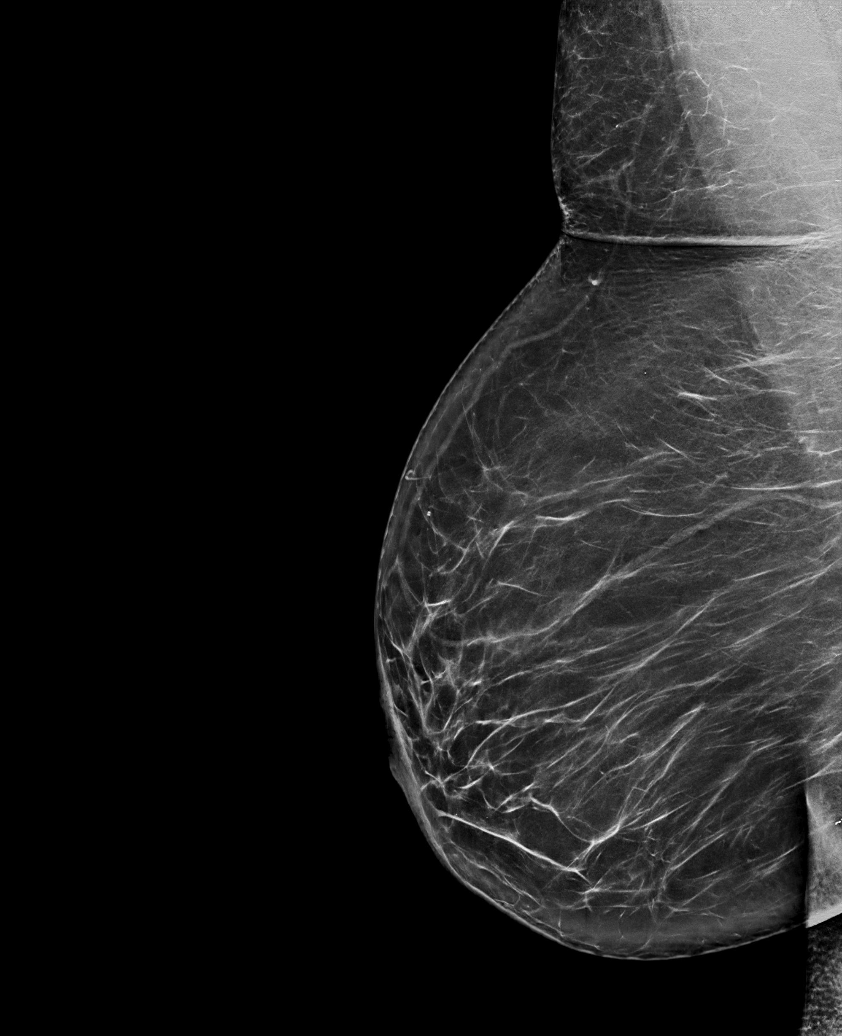

[L CC tomo · tomo slice 35/69.0]
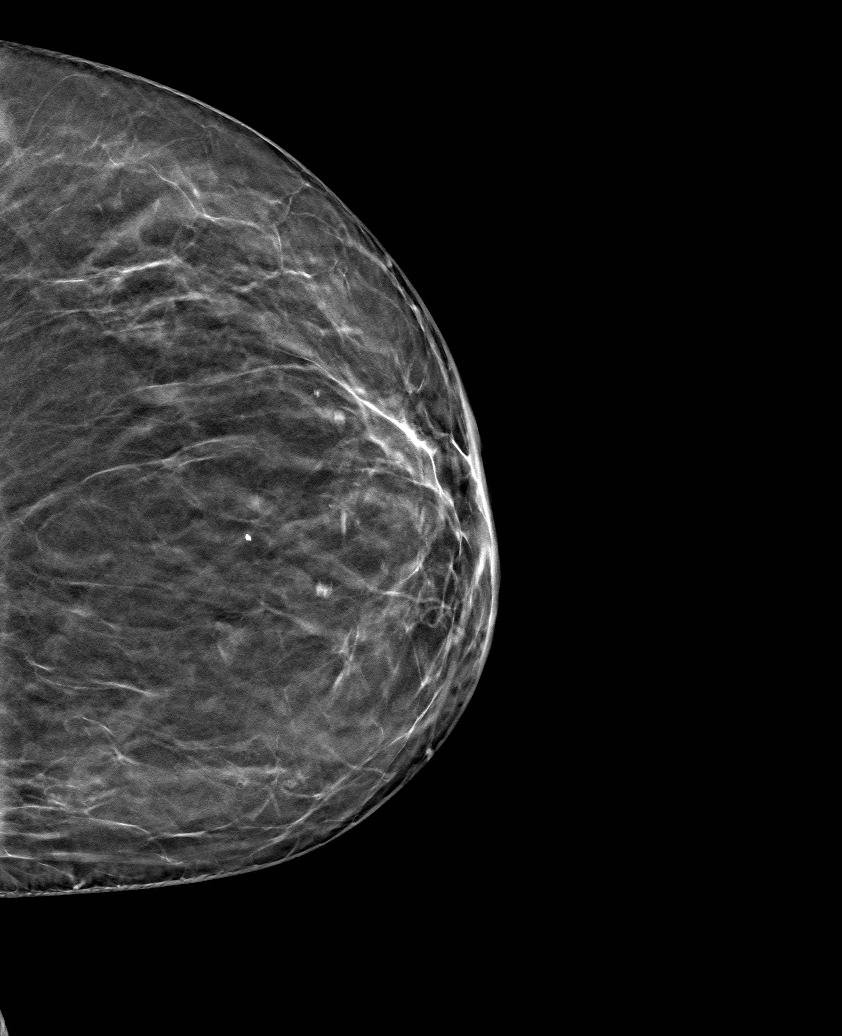

[6 of 30 positions shown; findings below may reference images not displayed]

FINDINGS: There are no findings suspicious for malignancy.
IMPRESSION: No mammographic evidence of malignancy. A result letter of this
screening mammogram will be mailed directly to the patient.

RECOMMENDATION:
Screening mammogram in one year. (Code:0E-3-N98)

BI-RADS CATEGORY  1: Negative.

## 2023-02-26 ENCOUNTER — Ambulatory Visit
Admission: RE | Admit: 2023-02-26 | Discharge: 2023-02-26 | Disposition: A | Discharge status: Home / Self Care | Payer: 59 | Source: Ambulatory Visit | Attending: Nurse Practitioner | Admitting: Nurse Practitioner

## 2023-02-26 DIAGNOSIS — R928 Other abnormal and inconclusive findings on diagnostic imaging of breast: Secondary | ICD-10-CM | POA: Insufficient documentation

## 2023-02-26 DIAGNOSIS — N6322 Unspecified lump in the left breast, upper inner quadrant: Secondary | ICD-10-CM | POA: Diagnosis not present

## 2023-03-03 ENCOUNTER — Other Ambulatory Visit: Payer: Self-pay

## 2023-03-03 ENCOUNTER — Emergency Department
Admission: EM | Admit: 2023-03-03 | Discharge: 2023-03-03 | Disposition: A | Discharge status: Home / Self Care | Payer: 59 | Attending: Emergency Medicine | Admitting: Emergency Medicine

## 2023-03-03 ENCOUNTER — Encounter: Payer: Self-pay | Admitting: Emergency Medicine

## 2023-03-03 ENCOUNTER — Emergency Department: Payer: No Typology Code available for payment source

## 2023-03-03 DIAGNOSIS — E119 Type 2 diabetes mellitus without complications: Secondary | ICD-10-CM | POA: Insufficient documentation

## 2023-03-03 DIAGNOSIS — Y92410 Unspecified street and highway as the place of occurrence of the external cause: Secondary | ICD-10-CM | POA: Diagnosis not present

## 2023-03-03 DIAGNOSIS — M25532 Pain in left wrist: Secondary | ICD-10-CM | POA: Diagnosis not present

## 2023-03-03 DIAGNOSIS — S6992XA Unspecified injury of left wrist, hand and finger(s), initial encounter: Secondary | ICD-10-CM | POA: Diagnosis not present

## 2023-03-03 DIAGNOSIS — I2510 Atherosclerotic heart disease of native coronary artery without angina pectoris: Secondary | ICD-10-CM | POA: Insufficient documentation

## 2023-03-03 DIAGNOSIS — Z743 Need for continuous supervision: Secondary | ICD-10-CM | POA: Diagnosis not present

## 2023-03-03 DIAGNOSIS — S63502A Unspecified sprain of left wrist, initial encounter: Secondary | ICD-10-CM | POA: Insufficient documentation

## 2023-03-03 DIAGNOSIS — S638X2A Sprain of other part of left wrist and hand, initial encounter: Secondary | ICD-10-CM | POA: Diagnosis not present

## 2023-03-03 DIAGNOSIS — M546 Pain in thoracic spine: Secondary | ICD-10-CM | POA: Insufficient documentation

## 2023-03-03 DIAGNOSIS — I10 Essential (primary) hypertension: Secondary | ICD-10-CM | POA: Insufficient documentation
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
## 2023-03-03 NOTE — ED Notes (Signed)
Thumb spica and sling applied to pt.

## 2023-03-03 NOTE — Discharge Instructions (Addendum)
Your x-rays today did not show that you have a fracture in the hand or wrist, however because of the location of your pain I am concerned that you have a hidden fracture of one of the bones in your wrist. Please wear the wrist brace at all times until cleared by orthopedics. Follow up in about one week for repeat xrays.  You can take 650 mg of Tylenol every 6 hours as needed for pain.
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
## 2023-03-15 ENCOUNTER — Other Ambulatory Visit: Payer: Medicaid Other

## 2023-03-15 DIAGNOSIS — I10 Essential (primary) hypertension: Secondary | ICD-10-CM

## 2023-03-15 DIAGNOSIS — E782 Mixed hyperlipidemia: Secondary | ICD-10-CM | POA: Diagnosis not present

## 2023-03-15 DIAGNOSIS — E119 Type 2 diabetes mellitus without complications: Secondary | ICD-10-CM | POA: Diagnosis not present

## 2023-03-16 ENCOUNTER — Encounter: Payer: Self-pay | Admitting: Family

## 2023-03-16 ENCOUNTER — Ambulatory Visit (INDEPENDENT_AMBULATORY_CARE_PROVIDER_SITE_OTHER): Payer: Medicaid Other | Admitting: Family

## 2023-03-16 ENCOUNTER — Other Ambulatory Visit: Payer: Self-pay | Admitting: Cardiovascular Disease

## 2023-03-16 VITALS — BP 125/70 | HR 62 | Ht 63.0 in | Wt 140.4 lb

## 2023-03-16 DIAGNOSIS — E1165 Type 2 diabetes mellitus with hyperglycemia: Secondary | ICD-10-CM

## 2023-03-16 DIAGNOSIS — E782 Mixed hyperlipidemia: Secondary | ICD-10-CM

## 2023-03-16 DIAGNOSIS — Z1211 Encounter for screening for malignant neoplasm of colon: Secondary | ICD-10-CM | POA: Diagnosis not present

## 2023-03-16 DIAGNOSIS — I10 Essential (primary) hypertension: Secondary | ICD-10-CM

## 2023-03-16 DIAGNOSIS — E119 Type 2 diabetes mellitus without complications: Secondary | ICD-10-CM

## 2023-03-16 LAB — CBC WITH DIFFERENTIAL/PLATELET
Basophils Absolute: 0 10*3/uL (ref 0.0–0.2)
Basos: 1 %
EOS (ABSOLUTE): 0.1 10*3/uL (ref 0.0–0.4)
Eos: 1 %
Hematocrit: 43.6 % (ref 34.0–46.6)
Hemoglobin: 14.7 g/dL (ref 11.1–15.9)
Immature Grans (Abs): 0 10*3/uL (ref 0.0–0.1)
Immature Granulocytes: 0 %
Lymphocytes Absolute: 1.3 10*3/uL (ref 0.7–3.1)
Lymphs: 24 %
MCH: 31.3 pg (ref 26.6–33.0)
MCHC: 33.7 g/dL (ref 31.5–35.7)
MCV: 93 fL (ref 79–97)
Monocytes Absolute: 0.3 10*3/uL (ref 0.1–0.9)
Monocytes: 6 %
Neutrophils Absolute: 3.6 10*3/uL (ref 1.4–7.0)
Neutrophils: 68 %
Platelets: 209 10*3/uL (ref 150–450)
RBC: 4.69 x10E6/uL (ref 3.77–5.28)
RDW: 12.5 % (ref 11.7–15.4)
WBC: 5.3 10*3/uL (ref 3.4–10.8)

## 2023-03-16 LAB — LIPID PANEL
Chol/HDL Ratio: 3.7 ratio (ref 0.0–4.4)
Cholesterol, Total: 131 mg/dL (ref 100–199)
HDL: 35 mg/dL — ABNORMAL LOW (ref >39)
LDL Chol Calc (NIH): 76 mg/dL (ref 0–99)
Triglycerides: 108 mg/dL (ref 0–149)
VLDL Cholesterol Cal: 20 mg/dL (ref 5–40)

## 2023-03-16 LAB — CMP14+EGFR
ALT: 76 IU/L — ABNORMAL HIGH (ref 0–32)
AST: 37 IU/L (ref 0–40)
Albumin: 4.4 g/dL (ref 3.8–4.9)
Alkaline Phosphatase: 71 IU/L (ref 44–121)
BUN/Creatinine Ratio: 22 (ref 9–23)
BUN: 16 mg/dL (ref 6–24)
Bilirubin Total: 0.4 mg/dL (ref 0.0–1.2)
CO2: 21 mmol/L (ref 20–29)
Calcium: 9.3 mg/dL (ref 8.7–10.2)
Chloride: 105 mmol/L (ref 96–106)
Creatinine, Ser: 0.73 mg/dL (ref 0.57–1.00)
Globulin, Total: 2.1 g/dL (ref 1.5–4.5)
Glucose: 227 mg/dL — ABNORMAL HIGH (ref 70–99)
Potassium: 4.6 mmol/L (ref 3.5–5.2)
Sodium: 141 mmol/L (ref 134–144)
Total Protein: 6.5 g/dL (ref 6.0–8.5)
eGFR: 95 mL/min/{1.73_m2} (ref >59)

## 2023-03-16 LAB — HEMOGLOBIN A1C
Est. average glucose Bld gHb Est-mCnc: 260 mg/dL
Hgb A1c MFr Bld: 10.7 % — ABNORMAL HIGH (ref 4.8–5.6)

## 2023-03-16 LAB — POCT CBG (FASTING - GLUCOSE)-MANUAL ENTRY: Glucose Fasting, POC: 212 mg/dL — AB (ref 70–99)

## 2023-03-16 MED ORDER — FREESTYLE LIBRE 3 SENSOR MISC: 1.0000 | 3 refills | Status: DC

## 2023-03-16 MED ORDER — TRULICITY 1.5 MG/0.5ML ~~LOC~~ SOAJ: 1.5000 mg | SUBCUTANEOUS | 1 refills | Status: DC

## 2023-03-16 MED ORDER — EMPAGLIFLOZIN 25 MG PO TABS: 25.0000 mg | ORAL_TABLET | Freq: Every day | ORAL | 3 refills | Status: DC

## 2023-03-16 MED ORDER — FREESTYLE LIBRE 3 READER DEVI: 1.0000 | 0 refills | Status: DC | PRN

## 2023-03-16 NOTE — Assessment & Plan Note (Signed)
Checking labs today. Will call pt. With results  Continue current diabetes POC, as patient has been well controlled on current regimen.  Will adjust meds if needed based on labs.  

## 2023-03-16 NOTE — Assessment & Plan Note (Signed)
Checking labs today.  Continue current therapy for lipid control. Will modify as needed based on labwork results.  

## 2023-03-16 NOTE — Assessment & Plan Note (Signed)
Blood pressure well controlled with current medications.  Continue current therapy.  Will reassess at follow up.  

## 2023-03-16 NOTE — Progress Notes (Signed)
Established Patient Office Visit  Subjective:  Patient ID: Leah Hinton, female    DOB: 09-10-1965  Age: 57 y.o. MRN: [id number]  Chief Complaint  Patient presents with   Follow-up    3 mo f/u    Patient is here today for her 3 months follow up.  She has been feeling fairly well since last appointment.   She does not have additional concerns to discuss today.  Labs were done yesterday, so we will review in detail today.  A1C is improved, but is still quite high.   She needs refills.   I have reviewed her active problem list, medication list, allergies, notes from last encounter, lab results for her appointment today.    No other concerns at this time.   Past Medical History:  Diagnosis Date   Anxiety    Coronary artery disease    Depression    Diabetes mellitus without complication (HCC)    Hypertension    Myocardial infarction (HCC)    PONV (postoperative nausea and vomiting)     Past Surgical History:  Procedure Laterality Date   ABDOMINAL HYSTERECTOMY  2012   CESAREAN SECTION     x 2   CORONARY ARTERY BYPASS GRAFT     GANGLION CYST EXCISION Left    TRIGGER FINGER RELEASE Left 07/03/2021   Procedure: RELEASE TRIGGER FINGER/A-1 PULLEY -  INCISION, TENDON SHEATH (SURG);  Surgeon: Deeann Saint, MD;  Location: [facility] ORS;  Service: Orthopedics;  Laterality: Left;    Social History   Socioeconomic History   Marital status: Widowed    Spouse name: Not on file   Number of children: Not on file   Years of education: Not on file   Highest education level: Not on file  Occupational History   Not on file  Tobacco Use   Smoking status: Never    Passive exposure: Never   Smokeless tobacco: Never  Vaping Use   Vaping status: Never Used  Substance and Sexual Activity   Alcohol use: Never   Drug use: Never   Sexual activity: Not on file  Other Topics Concern   Not on file  Social History Narrative   Lives with mother and step dad.   Social Determinants  of Health   Financial Resource Strain: Not on file  Food Insecurity: Not on file  Transportation Needs: Not on file  Physical Activity: Not on file  Stress: Not on file  Social Connections: Not on file  Intimate Partner Violence: Not on file    Family History  Problem Relation Age of Onset   Breast cancer Maternal Grandmother 34    Allergies  Allergen Reactions   Semaglutide Nausea And Vomiting   Metoprolol Tartrate     Headaches, tolerates metoprolol succinate     Review of Systems  All other systems reviewed and are negative.      Objective:   BP 125/70   Pulse 62   Ht 5\' 3"  (1.6 m)   Wt 140 lb 6.4 oz (63.7 kg)   SpO2 99%   BMI 24.87 kg/m   Vitals:   03/16/23 0904  BP: 125/70  Pulse: 62  Height: 5\' 3"  (1.6 m)  Weight: 140 lb 6.4 oz (63.7 kg)  SpO2: 99%  BMI (Calculated): 24.88    Physical Exam Vitals and nursing note reviewed.  Constitutional:      Appearance: Normal appearance. She is normal weight.  HENT:     Head: Normocephalic.  Eyes:  Pupils: Pupils are equal, round, and reactive to light.  Cardiovascular:     Rate and Rhythm: Normal rate.  Pulmonary:     Effort: Pulmonary effort is normal.  Neurological:     Mental Status: She is alert.      Results for orders placed or performed in visit on 03/16/23  POCT CBG (Fasting - Glucose)  Result Value Ref Range   Glucose Fasting, POC 212 (A) 70 - 99 mg/dL    Recent Results (from the past 2160 hour(s))  CBC with Differential/Platelet     Status: None   Collection Time: 03/15/23 10:18 AM  Result Value Ref Range   WBC 5.3 3.4 - 10.8 x10E3/uL   RBC 4.69 3.77 - 5.28 x10E6/uL   Hemoglobin 14.7 11.1 - 15.9 g/dL   Hematocrit 13.2 44.0 - 46.6 %   MCV 93 79 - 97 fL   MCH 31.3 26.6 - 33.0 pg   MCHC 33.7 31.5 - 35.7 g/dL   RDW 10.2 72.5 - 36.6 %   Platelets 209 150 - 450 x10E3/uL   Neutrophils 68 Not Estab. %   Lymphs 24 Not Estab. %   Monocytes 6 Not Estab. %   Eos 1 Not Estab. %    Basos 1 Not Estab. %   Neutrophils Absolute 3.6 1.4 - 7.0 x10E3/uL   Lymphocytes Absolute 1.3 0.7 - 3.1 x10E3/uL   Monocytes Absolute 0.3 0.1 - 0.9 x10E3/uL   EOS (ABSOLUTE) 0.1 0.0 - 0.4 x10E3/uL   Basophils Absolute 0.0 0.0 - 0.2 x10E3/uL   Immature Granulocytes 0 Not Estab. %   Immature Grans (Abs) 0.0 0.0 - 0.1 x10E3/uL  CMP14+EGFR     Status: Abnormal   Collection Time: 03/15/23 10:18 AM  Result Value Ref Range   Glucose 227 (H) 70 - 99 mg/dL   BUN 16 6 - 24 mg/dL   Creatinine, Ser 4.40 0.57 - 1.00 mg/dL   eGFR 95 >34 VQ/QVZ/5.63   BUN/Creatinine Ratio 22 9 - 23   Sodium 141 134 - 144 mmol/L   Potassium 4.6 3.5 - 5.2 mmol/L   Chloride 105 96 - 106 mmol/L   CO2 21 20 - 29 mmol/L   Calcium 9.3 8.7 - 10.2 mg/dL   Total Protein 6.5 6.0 - 8.5 g/dL   Albumin 4.4 3.8 - 4.9 g/dL   Globulin, Total 2.1 1.5 - 4.5 g/dL   Bilirubin Total 0.4 0.0 - 1.2 mg/dL   Alkaline Phosphatase 71 44 - 121 IU/L   AST 37 0 - 40 IU/L   ALT 76 (H) 0 - 32 IU/L  Hemoglobin A1c     Status: Abnormal   Collection Time: 03/15/23 10:18 AM  Result Value Ref Range   Hgb A1c MFr Bld 10.7 (H) 4.8 - 5.6 %    Comment:          Prediabetes: 5.7 - 6.4          Diabetes: >6.4          Glycemic control for adults with diabetes: <7.0    Est. average glucose Bld gHb Est-mCnc 260 mg/dL  Lipid panel     Status: Abnormal   Collection Time: 03/15/23 10:18 AM  Result Value Ref Range   Cholesterol, Total 131 100 - 199 mg/dL   Triglycerides 875 0 - 149 mg/dL   HDL 35 (L) >64 mg/dL   VLDL Cholesterol Cal 20 5 - 40 mg/dL   LDL Chol Calc (NIH) 76 0 - 99 mg/dL   Chol/HDL  Ratio 3.7 0.0 - 4.4 ratio    Comment:                                   T. Chol/HDL Ratio                                             Men  Women                               1/2 Avg.Risk  3.4    3.3                                   Avg.Risk  5.0    4.4                                2X Avg.Risk  9.6    7.1                                3X Avg.Risk  23.4   11.0   POCT CBG (Fasting - Glucose)     Status: Abnormal   Collection Time: 03/16/23  9:09 AM  Result Value Ref Range   Glucose Fasting, POC 212 (A) 70 - 99 mg/dL       Assessment & Plan:   Problem List Items Addressed This Visit       Active Problems   Type 2 diabetes mellitus with hyperglycemia (HCC) - Primary    Checking labs today. Will call pt. With results  Continue current diabetes POC, as patient has been well controlled on current regimen.  Will adjust meds if needed based on labs.       Relevant Medications   empagliflozin (JARDIANCE) 25 MG TABS tablet   Dulaglutide (TRULICITY) 1.5 MG/0.5ML SOPN   Continuous Glucose Sensor (FREESTYLE LIBRE 3 SENSOR) MISC   Continuous Glucose Receiver (FREESTYLE LIBRE 3 READER) DEVI   Other Relevant Orders   POCT CBG (Fasting - Glucose) (Completed)   Mixed hyperlipidemia    Checking labs today.  Continue current therapy for lipid control. Will modify as needed based on labwork results.       Essential hypertension, benign    Blood pressure well controlled with current medications.  Continue current therapy.  Will reassess at follow up.        Other Visit Diagnoses     Encounter for screening for malignant neoplasm of colon       sending order for cologuard will await results Patient agrees to return kit when she gets it.   Relevant Orders   Cologuard       Return in about 3 months (around 06/16/2023) for F/U.   Total time spent: 20 minutes  Miki Kins, FNP  03/16/2023   This document may have been prepared by [facility] Voice Recognition software and as such may include unintentional dictation errors.

## 2023-03-17 LAB — POC CREATINE & ALBUMIN,URINE
Albumin/Creatinine Ratio, Urine, POC: <30
Creatinine, POC: 50 mg/dL
Microalbumin Ur, POC: 10 mg/L

## 2023-03-17 NOTE — Addendum Note (Signed)
Addended by: Matthew Saras on: 03/17/2023 11:53 AM   Modules accepted: Orders

## 2023-03-20 ENCOUNTER — Other Ambulatory Visit: Payer: Self-pay | Admitting: Cardiovascular Disease

## 2023-03-20 ENCOUNTER — Other Ambulatory Visit: Payer: Self-pay | Admitting: Family

## 2023-03-20 DIAGNOSIS — I340 Nonrheumatic mitral (valve) insufficiency: Secondary | ICD-10-CM

## 2023-03-26 ENCOUNTER — Other Ambulatory Visit: Payer: Self-pay | Admitting: Cardiovascular Disease

## 2023-03-27 DIAGNOSIS — Z1211 Encounter for screening for malignant neoplasm of colon: Secondary | ICD-10-CM | POA: Diagnosis not present

## 2023-04-02 LAB — COLOGUARD: COLOGUARD: NEGATIVE

## 2023-04-12 ENCOUNTER — Ambulatory Visit: Payer: Medicaid Other | Admitting: Cardiovascular Disease

## 2023-04-12 ENCOUNTER — Ambulatory Visit: Payer: 59 | Admitting: Cardiovascular Disease

## 2023-04-13 ENCOUNTER — Encounter: Payer: Self-pay | Admitting: Cardiovascular Disease

## 2023-04-13 ENCOUNTER — Ambulatory Visit (INDEPENDENT_AMBULATORY_CARE_PROVIDER_SITE_OTHER): Payer: 59 | Admitting: Cardiovascular Disease

## 2023-04-13 VITALS — BP 110/70 | HR 70 | Ht 63.0 in | Wt 142.6 lb

## 2023-04-13 DIAGNOSIS — I10 Essential (primary) hypertension: Secondary | ICD-10-CM

## 2023-04-13 DIAGNOSIS — E782 Mixed hyperlipidemia: Secondary | ICD-10-CM | POA: Diagnosis not present

## 2023-04-13 DIAGNOSIS — E1165 Type 2 diabetes mellitus with hyperglycemia: Secondary | ICD-10-CM | POA: Diagnosis not present

## 2023-04-13 DIAGNOSIS — I25810 Atherosclerosis of coronary artery bypass graft(s) without angina pectoris: Secondary | ICD-10-CM | POA: Diagnosis not present

## 2023-04-13 MED ORDER — ATORVASTATIN CALCIUM 80 MG PO TABS
80.0000 mg | ORAL_TABLET | Freq: Every day | ORAL | 0 refills | Status: DC
Start: 2023-04-13 — End: 2023-07-12

## 2023-04-13 MED ORDER — EZETIMIBE 10 MG PO TABS
10.0000 mg | ORAL_TABLET | Freq: Every day | ORAL | 1 refills | Status: DC
Start: 2023-04-13 — End: 2023-10-25

## 2023-04-13 NOTE — Progress Notes (Addendum)
Cardiology Office Note   Date:  04/13/2023   ID:  GERRE YOSHINO, DOB 1965/12/27, MRN [id number]  PCP:  Miki Kins, FNP  Cardiologist:  Adrian Blackwater, MD      History of Present Illness: Leah Hinton is a 57 y.o. female who presents for  Chief Complaint  Patient presents with   Follow-up    3 mo    Doing well      Past Medical History:  Diagnosis Date   Anxiety    Coronary artery disease    Depression    Diabetes mellitus without complication (HCC)    Hypertension    Myocardial infarction (HCC)    PONV (postoperative nausea and vomiting)      Past Surgical History:  Procedure Laterality Date   ABDOMINAL HYSTERECTOMY  2012   CESAREAN SECTION     x 2   CORONARY ARTERY BYPASS GRAFT     GANGLION CYST EXCISION Left    TRIGGER FINGER RELEASE Left 07/03/2021   Procedure: RELEASE TRIGGER FINGER/A-1 PULLEY -  INCISION, TENDON SHEATH (SURG);  Surgeon: Deeann Saint, MD;  Location: [facility] ORS;  Service: Orthopedics;  Laterality: Left;     Current Outpatient Medications  Medication Sig Dispense Refill   acarbose (PRECOSE) 100 MG tablet Take 1 tablet (100 mg total) by mouth 3 (three) times daily. 270 tablet 3   acetaminophen (TYLENOL) 500 MG tablet Take 1,000 mg by mouth every 6 (six) hours as needed for moderate pain.     albuterol (VENTOLIN HFA) 108 (90 Base) MCG/ACT inhaler Inhale 1 puff into the lungs every 6 (six) hours as needed for shortness of breath.     aspirin EC 81 MG tablet Take 81 mg by mouth daily. Swallow whole.     atorvastatin (LIPITOR) 80 MG tablet Take 1 tablet (80 mg total) by mouth daily. 90 tablet 0   Cholecalciferol (DIALYVITE VITAMIN D 5000) 125 MCG (5000 UT) capsule Take 5,000 Units by mouth daily.     citalopram (CELEXA) 40 MG tablet Take 1 tablet (40 mg total) by mouth at bedtime. 90 tablet 3   clopidogrel (PLAVIX) 75 MG tablet TAKE 1 TABLET(75 MG) BY MOUTH DAILY 90 tablet 2   Continuous Glucose Receiver (FREESTYLE LIBRE 3 READER)  DEVI 1 Device by Does not apply route as needed (use to check blood sugars twice daily and as needed.). 1 each 0   Continuous Glucose Sensor (FREESTYLE LIBRE 3 SENSOR) MISC 1 each by Does not apply route every 14 (fourteen) days. Place 1 sensor on the skin every 14 days. Use to check glucose continuously 6 each 3   cyclobenzaprine (FLEXERIL) 5 MG tablet Take 1-2 tablets (5-10 mg total) by mouth 3 (three) times daily as needed for muscle spasms. 20 tablet 0   Dulaglutide (TRULICITY) 1.5 MG/0.5ML SOPN Inject 1.5 mg into the skin once a week. 6 mL 1   empagliflozin (JARDIANCE) 25 MG TABS tablet Take 1 tablet (25 mg total) by mouth daily. 90 tablet 3   ezetimibe (ZETIA) 10 MG tablet Take 1 tablet (10 mg total) by mouth daily. 90 tablet 1   glipiZIDE (GLUCOTROL XL) 10 MG 24 hr tablet Take 1 tablet (10 mg total) by mouth daily. 90 tablet 3   levocetirizine (XYZAL) 5 MG tablet Take 5 mg by mouth at bedtime as needed for allergies.     lisinopril (ZESTRIL) 5 MG tablet Take 1 tablet (5 mg total) by mouth daily. 90 tablet 3   metoprolol  succinate (TOPROL-XL) 25 MG 24 hr tablet TAKE 1 TABLET(25 MG) BY MOUTH DAILY 90 tablet 0   traZODone (DESYREL) 50 MG tablet TAKE 1 TABLET BY MOUTH EVERY NIGHT AT BEDTIME AS NEEDED FOR SLEEP 30 tablet 2   zolpidem (AMBIEN) 5 MG tablet Take 5 mg by mouth at bedtime.     No current facility-administered medications for this visit.    Allergies:   Semaglutide and Metoprolol tartrate    Social History:   reports that she has never smoked. She has never been exposed to tobacco smoke. She has never used smokeless tobacco. She reports that she does not drink alcohol and does not use drugs.   Family History:  family history includes Breast cancer (age of onset: 1) in her maternal grandmother.    ROS:     Review of Systems  Constitutional: Negative.   HENT: Negative.    Eyes: Negative.   Respiratory: Negative.    Gastrointestinal: Negative.   Genitourinary: Negative.    Musculoskeletal: Negative.   Skin: Negative.   Neurological: Negative.   Endo/Heme/Allergies: Negative.   Psychiatric/Behavioral: Negative.    All other systems reviewed and are negative.     All other systems are reviewed and negative.    PHYSICAL EXAM: VS:  BP 110/70   Pulse 70   Ht 5\' 3"  (1.6 m)   Wt 142 lb 9.6 oz (64.7 kg)   SpO2 98%   BMI 25.26 kg/m  , BMI Body mass index is 25.26 kg/m. Last weight:  Wt Readings from Last 3 Encounters:  04/13/23 142 lb 9.6 oz (64.7 kg)  03/16/23 140 lb 6.4 oz (63.7 kg)  03/03/23 145 lb (65.8 kg)     Physical Exam Constitutional:      Appearance: Normal appearance.  Cardiovascular:     Rate and Rhythm: Normal rate and regular rhythm.     Heart sounds: Normal heart sounds.  Pulmonary:     Effort: Pulmonary effort is normal.     Breath sounds: Normal breath sounds.  Musculoskeletal:     Right lower leg: No edema.     Left lower leg: No edema.  Neurological:     Mental Status: She is alert.       EKG:   Recent Labs: 12/03/2022: TSH 2.340 03/15/2023: ALT 76; BUN 16; Creatinine, Ser 0.73; Hemoglobin 14.7; Platelets 209; Potassium 4.6; Sodium 141    Lipid Panel    Component Value Date/Time   CHOL 131 03/15/2023 1018   TRIG 108 03/15/2023 1018   HDL 35 (L) 03/15/2023 1018   CHOLHDL 3.7 03/15/2023 1018   LDLCALC 76 03/15/2023 1018      Other studies Reviewed: Additional studies/ records that were reviewed today include:  Review of the above records demonstrates:       No data to display            ASSESSMENT AND PLAN:    ICD-10-CM   1. Coronary artery disease involving coronary bypass graft of native heart without angina pectoris  I25.810    stable    2. Essential hypertension, benign  I10     3. Mixed hyperlipidemia  E78.2 atorvastatin (LIPITOR) 80 MG tablet    ezetimibe (ZETIA) 10 MG tablet    4. Type 2 diabetes mellitus with hyperglycemia, without long-term current use of insulin (HCC)  E11.65         Problem List Items Addressed This Visit       Cardiovascular and Mediastinum   Coronary artery  disease - Primary   Relevant Medications   atorvastatin (LIPITOR) 80 MG tablet   ezetimibe (ZETIA) 10 MG tablet   Essential hypertension, benign   Relevant Medications   atorvastatin (LIPITOR) 80 MG tablet   ezetimibe (ZETIA) 10 MG tablet     Endocrine   Type 2 diabetes mellitus with hyperglycemia (HCC)   Relevant Medications   atorvastatin (LIPITOR) 80 MG tablet     Other   Mixed hyperlipidemia   Relevant Medications   atorvastatin (LIPITOR) 80 MG tablet   ezetimibe (ZETIA) 10 MG tablet       Disposition:   Return in about 3 months (around 07/13/2023).    Total time spent: 30 minutes  Signed,  Adrian Blackwater, MD  04/13/2023 11:06 AM    [facility]

## 2023-04-13 NOTE — Addendum Note (Signed)
Addended by: Adrian Blackwater A on: 04/13/2023 11:06 AM   Modules accepted: Orders

## 2023-04-14 ENCOUNTER — Encounter: Payer: Self-pay | Admitting: Family

## 2023-04-20 ENCOUNTER — Other Ambulatory Visit: Payer: Self-pay

## 2023-04-20 MED ORDER — DEXCOM G7 RECEIVER DEVI: 0 refills | Status: DC

## 2023-04-20 MED ORDER — DEXCOM G7 SENSOR MISC: 10 refills | Status: DC

## 2023-04-23 NOTE — Addendum Note (Signed)
Addended by: Grayling Congress on: 04/23/2023 04:34 PM   Modules accepted: Orders

## 2023-04-26 ENCOUNTER — Encounter: Payer: Self-pay | Admitting: Family

## 2023-05-06 ENCOUNTER — Telehealth: Payer: Self-pay | Admitting: Family

## 2023-05-06 NOTE — Telephone Encounter (Signed)
Phoebe Sharps with [facility] left VM that this patient has been unable to get her Dexcom from her pharmacy because it needs a PA. Due to [place] not receiving a PA request, Denny Peon can give [place] the information needed on how to complete the PA.   Callback # [phone]

## 2023-05-16 ENCOUNTER — Encounter: Payer: Self-pay | Admitting: Family

## 2023-05-17 ENCOUNTER — Other Ambulatory Visit: Payer: Self-pay | Admitting: Family

## 2023-05-17 MED ORDER — TRAZODONE HCL 50 MG PO TABS: ORAL_TABLET | ORAL | 2 refills | Status: DC

## 2023-05-17 NOTE — Addendum Note (Signed)
Addended byKatherine Mantle on: 05/17/2023 11:58 AM   Modules accepted: Orders

## 2023-05-20 ENCOUNTER — Other Ambulatory Visit: Payer: Self-pay | Admitting: Cardiovascular Disease

## 2023-05-20 DIAGNOSIS — I340 Nonrheumatic mitral (valve) insufficiency: Secondary | ICD-10-CM

## 2023-05-21 ENCOUNTER — Other Ambulatory Visit: Payer: Self-pay

## 2023-05-24 ENCOUNTER — Other Ambulatory Visit: Payer: Self-pay

## 2023-06-11 ENCOUNTER — Other Ambulatory Visit: Payer: Medicaid Other

## 2023-06-11 DIAGNOSIS — E782 Mixed hyperlipidemia: Secondary | ICD-10-CM | POA: Diagnosis not present

## 2023-06-11 DIAGNOSIS — I10 Essential (primary) hypertension: Secondary | ICD-10-CM | POA: Diagnosis not present

## 2023-06-11 DIAGNOSIS — E1165 Type 2 diabetes mellitus with hyperglycemia: Secondary | ICD-10-CM | POA: Diagnosis not present

## 2023-06-12 LAB — CMP14+EGFR
ALT: 64 [IU]/L — ABNORMAL HIGH (ref 0–32)
AST: 31 [IU]/L (ref 0–40)
Albumin: 4.4 g/dL (ref 3.8–4.9)
Alkaline Phosphatase: 64 [IU]/L (ref 44–121)
BUN/Creatinine Ratio: 19 (ref 9–23)
BUN: 14 mg/dL (ref 6–24)
Bilirubin Total: 0.5 mg/dL (ref 0.0–1.2)
CO2: 22 mmol/L (ref 20–29)
Calcium: 9.8 mg/dL (ref 8.7–10.2)
Chloride: 106 mmol/L (ref 96–106)
Creatinine, Ser: 0.73 mg/dL (ref 0.57–1.00)
Globulin, Total: 2.4 g/dL (ref 1.5–4.5)
Glucose: 219 mg/dL — ABNORMAL HIGH (ref 70–99)
Potassium: 4.6 mmol/L (ref 3.5–5.2)
Sodium: 142 mmol/L (ref 134–144)
Total Protein: 6.8 g/dL (ref 6.0–8.5)
eGFR: 95 mL/min/{1.73_m2} (ref >59)

## 2023-06-12 LAB — LIPID PANEL
Chol/HDL Ratio: 3.2 ratio (ref 0.0–4.4)
Cholesterol, Total: 123 mg/dL (ref 100–199)
HDL: 38 mg/dL — ABNORMAL LOW (ref >39)
LDL Chol Calc (NIH): 64 mg/dL (ref 0–99)
Triglycerides: 115 mg/dL (ref 0–149)
VLDL Cholesterol Cal: 21 mg/dL (ref 5–40)

## 2023-06-12 LAB — CBC WITH DIFFERENTIAL/PLATELET
Basophils Absolute: 0 10*3/uL (ref 0.0–0.2)
Basos: 1 %
EOS (ABSOLUTE): 0.1 10*3/uL (ref 0.0–0.4)
Eos: 1 %
Hematocrit: 46.9 % — ABNORMAL HIGH (ref 34.0–46.6)
Hemoglobin: 14.9 g/dL (ref 11.1–15.9)
Immature Grans (Abs): 0 10*3/uL (ref 0.0–0.1)
Immature Granulocytes: 0 %
Lymphocytes Absolute: 1.1 10*3/uL (ref 0.7–3.1)
Lymphs: 23 %
MCH: 30.5 pg (ref 26.6–33.0)
MCHC: 31.8 g/dL (ref 31.5–35.7)
MCV: 96 fL (ref 79–97)
Monocytes Absolute: 0.3 10*3/uL (ref 0.1–0.9)
Monocytes: 6 %
Neutrophils Absolute: 3.4 10*3/uL (ref 1.4–7.0)
Neutrophils: 69 %
Platelets: 228 10*3/uL (ref 150–450)
RBC: 4.89 x10E6/uL (ref 3.77–5.28)
RDW: 12.1 % (ref 11.7–15.4)
WBC: 4.8 10*3/uL (ref 3.4–10.8)

## 2023-06-12 LAB — HEMOGLOBIN A1C
Est. average glucose Bld gHb Est-mCnc: 235 mg/dL
Hgb A1c MFr Bld: 9.8 % — ABNORMAL HIGH (ref 4.8–5.6)

## 2023-06-14 ENCOUNTER — Ambulatory Visit (INDEPENDENT_AMBULATORY_CARE_PROVIDER_SITE_OTHER): Payer: 59 | Admitting: Family

## 2023-06-14 ENCOUNTER — Encounter: Payer: Self-pay | Admitting: Family

## 2023-06-14 ENCOUNTER — Telehealth: Payer: Self-pay

## 2023-06-14 VITALS — BP 103/70 | HR 57 | Ht 63.0 in | Wt 139.6 lb

## 2023-06-14 DIAGNOSIS — E559 Vitamin D deficiency, unspecified: Secondary | ICD-10-CM

## 2023-06-14 DIAGNOSIS — E1165 Type 2 diabetes mellitus with hyperglycemia: Secondary | ICD-10-CM | POA: Diagnosis not present

## 2023-06-14 DIAGNOSIS — R5383 Other fatigue: Secondary | ICD-10-CM

## 2023-06-14 DIAGNOSIS — E782 Mixed hyperlipidemia: Secondary | ICD-10-CM

## 2023-06-14 DIAGNOSIS — Z23 Encounter for immunization: Secondary | ICD-10-CM

## 2023-06-14 DIAGNOSIS — Z1159 Encounter for screening for other viral diseases: Secondary | ICD-10-CM | POA: Diagnosis not present

## 2023-06-14 DIAGNOSIS — E538 Deficiency of other specified B group vitamins: Secondary | ICD-10-CM | POA: Diagnosis not present

## 2023-06-14 DIAGNOSIS — E039 Hypothyroidism, unspecified: Secondary | ICD-10-CM

## 2023-06-14 DIAGNOSIS — Z794 Long term (current) use of insulin: Secondary | ICD-10-CM

## 2023-06-14 DIAGNOSIS — Z114 Encounter for screening for human immunodeficiency virus [HIV]: Secondary | ICD-10-CM

## 2023-06-14 DIAGNOSIS — I10 Essential (primary) hypertension: Secondary | ICD-10-CM | POA: Diagnosis not present

## 2023-06-14 LAB — GLUCOSE, POCT (MANUAL RESULT ENTRY): POC Glucose: 208 mg/dL — AB (ref 70–99)

## 2023-06-14 MED ORDER — TRULICITY 3 MG/0.5ML ~~LOC~~ SOAJ: 3.0000 mg | SUBCUTANEOUS | 1 refills | Status: DC

## 2023-06-14 NOTE — Assessment & Plan Note (Signed)
Blood pressure well controlled with current medications.  Continue current therapy.  Will reassess at follow up.  

## 2023-06-14 NOTE — Assessment & Plan Note (Signed)
Checking labs today.  Continue current therapy for lipid control. Will modify as needed based on labwork results.  

## 2023-06-14 NOTE — Telephone Encounter (Signed)
Medicines sent to wrong pharmacy. She uses [facility] in [place] not [facility].

## 2023-06-14 NOTE — Progress Notes (Signed)
Established Patient Office Visit  Subjective:  Patient ID: Leah Hinton, female    DOB: September 17, 1965  Age: 57 y.o. MRN: [id number]  Chief Complaint  Patient presents with   Follow-up    3 month    Patient is here today for her 3 months follow up.  She has been feeling well since last appointment.   She does not have additional concerns to discuss today.  Labs were done last week, so we will review these in detail today.  Needs her flu shot.  She needs refills.   I have reviewed her active problem list, medication list, allergies, health maintenance, notes from last encounter, lab results for her appointment today.    No other concerns at this time.   Past Medical History:  Diagnosis Date   Anxiety    Coronary artery disease    Depression    Diabetes mellitus without complication (HCC)    Hypertension    Myocardial infarction (HCC)    PONV (postoperative nausea and vomiting)     Past Surgical History:  Procedure Laterality Date   ABDOMINAL HYSTERECTOMY  2012   CESAREAN SECTION     x 2   CORONARY ARTERY BYPASS GRAFT     GANGLION CYST EXCISION Left    TRIGGER FINGER RELEASE Left 07/03/2021   Procedure: RELEASE TRIGGER FINGER/A-1 PULLEY -  INCISION, TENDON SHEATH (SURG);  Surgeon: Deeann Saint, MD;  Location: [facility] ORS;  Service: Orthopedics;  Laterality: Left;    Social History   Socioeconomic History   Marital status: Widowed    Spouse name: Not on file   Number of children: Not on file   Years of education: Not on file   Highest education level: Not on file  Occupational History   Not on file  Tobacco Use   Smoking status: Never    Passive exposure: Never   Smokeless tobacco: Never  Vaping Use   Vaping status: Never Used  Substance and Sexual Activity   Alcohol use: Never   Drug use: Never   Sexual activity: Not on file  Other Topics Concern   Not on file  Social History Narrative   Lives with mother and step dad.   Social Determinants of  Health   Financial Resource Strain: Not on file  Food Insecurity: Not on file  Transportation Needs: Not on file  Physical Activity: Not on file  Stress: Not on file  Social Connections: Not on file  Intimate Partner Violence: Not on file    Family History  Problem Relation Age of Onset   Breast cancer Maternal Grandmother 64    Allergies  Allergen Reactions   Semaglutide Nausea And Vomiting   Metoprolol Tartrate     Headaches, tolerates metoprolol succinate     Review of Systems  All other systems reviewed and are negative.      Objective:   BP 103/70   Pulse (!) 57   Ht 5\' 3"  (1.6 m)   Wt 139 lb 9.6 oz (63.3 kg)   SpO2 97%   BMI 24.73 kg/m   Vitals:   06/14/23 0935  BP: 103/70  Pulse: (!) 57  Height: 5\' 3"  (1.6 m)  Weight: 139 lb 9.6 oz (63.3 kg)  SpO2: 97%  BMI (Calculated): 24.74    Physical Exam Vitals and nursing note reviewed.  Constitutional:      Appearance: Normal appearance. She is obese.  HENT:     Head: Normocephalic.  Eyes:     Extraocular  Movements: Extraocular movements intact.     Conjunctiva/sclera: Conjunctivae normal.     Pupils: Pupils are equal, round, and reactive to light.  Cardiovascular:     Rate and Rhythm: Normal rate.  Pulmonary:     Effort: Pulmonary effort is normal.  Neurological:     General: No focal deficit present.     Mental Status: She is alert and oriented to person, place, and time. Mental status is at baseline.  Psychiatric:        Mood and Affect: Mood normal.        Behavior: Behavior normal.        Thought Content: Thought content normal.        Judgment: Judgment normal.      Results for orders placed or performed in visit on 06/14/23  POCT Glucose (CBG)  Result Value Ref Range   POC Glucose 208 (A) 70 - 99 mg/dl    Recent Results (from the past 2160 hour(s))  POC CREATINE & ALBUMIN,URINE     Status: None   Collection Time: 03/17/23 11:52 AM  Result Value Ref Range   Microalbumin Ur, POC  10 mg/L   Creatinine, POC 50 mg/dL   Albumin/Creatinine Ratio, Urine, POC <30   Cologuard     Status: None   Collection Time: 03/27/23 11:20 AM  Result Value Ref Range   COLOGUARD Negative Negative    Comment:  NEGATIVE TEST RESULT. A negative Cologuard result indicates a low likelihood that a colorectal cancer (CRC) or advanced adenoma (adenomatous polyps with more advanced pre-malignant features)  is present. The chance that a person with a negative Cologuard test has a colorectal cancer is less than 1 in 1500 (negative predictive value >99.9%) or has an  advanced adenoma is less than  5.3% (negative predictive value 94.7%). These data are based on a prospective cross-sectional study of 10,000 individuals at average risk for colorectal cancer who were screened with both Cologuard and colonoscopy. (Imperiale T. et al, N Engl J Med 2014;370(14):1286-1297) The normal value (reference range) for this assay is negative.  COLOGUARD RE-SCREENING RECOMMENDATION: Periodic colorectal cancer screening is an important part of preventive healthcare for asymptomatic individuals at average risk for colorectal cancer.  Following a negative Cologuard result, the American Cancer Society and U.S.  Multi-Society Task Force screening guidelines recommend a Cologuard re-screening interval of 3 years.  References: American Cancer Society Guideline for Colorectal Cancer Screening: https://www.cancer.org/cancer/colon-rectal-cancer/detection-diagnosis-staging/acs-recommendations.html.; Rex DK, Boland CR, Dominitz JK, Colorectal Cancer Screening: Recommendations for Physicians and Patients from the U.S. Multi-Society Task Force on Colorectal Cancer Screening , Am J Gastroenterology 2017; 112:1016-1030.  TEST DESCRIPTION: Composite algorithmic analysis of stool DNA-biomarkers with hemoglobin immunoassay.   Quantitative values of individual biomarkers are not reportable and are not associated with individual biomarker result  reference ranges. Cologuard is intended for colorectal cancer screening of adults of either sex, 45 years or older, who are at average-risk for colorectal cancer (CRC). Cologuard has been approved for use by the U.S. FDA. The performance of Cologuard was  established in a cross sectional study of average-risk adults aged 42-84. Cologuard performance in patients ages 69 to 39 years was estimated by sub-group analysis of near-age groups. Colonoscopies performed for a positive result may find as the most clinically significant lesion: colorectal cancer [4.0%], advanced adenoma (including sessile serrated polyps greater than or equal to 1cm diameter) [20%] or non- advanced adenoma [31%]; or no colorectal neoplasia [45%]. These estimates are derived from a prospective cross-sectional screening study of  10,000 individuals at average risk for colorectal cancer who were screened with both Cologuard and colonoscopy. (Imperiale T. et al, Macy Mis J Med 2014;370(14):1286-1297.) Cologuard may produce a false negative or false positive result (no colorectal cancer or precancerous polyp present at colonoscopy follow up). A negative Cologuard test result does not guarantee the absence of CRC or advanced adenoma (pre-cancer). The current Cologuard  screening interval is every 3 years. Science writer and [place] Therapist, music). Cologuard performance data in a 10,000 patient pivotal study using colonoscopy as the reference method can be accessed at the following location: www.exactlabs.com/results. Additional description of the Cologuard test process, warnings and precautions can be found at www.cologuard.com.   Lipid panel     Status: Abnormal   Collection Time: 06/11/23 10:18 AM  Result Value Ref Range   Cholesterol, Total 123 100 - 199 mg/dL   Triglycerides 540 0 - 149 mg/dL   HDL 38 (L) >98 mg/dL   VLDL Cholesterol Cal 21 5 - 40 mg/dL   LDL Chol Calc (NIH) 64 0 - 99 mg/dL   Chol/HDL Ratio 3.2 0.0 - 4.4  ratio    Comment:                                   T. Chol/HDL Ratio                                             Men  Women                               1/2 Avg.Risk  3.4    3.3                                   Avg.Risk  5.0    4.4                                2X Avg.Risk  9.6    7.1                                3X Avg.Risk 23.4   11.0   Hemoglobin A1c     Status: Abnormal   Collection Time: 06/11/23 10:18 AM  Result Value Ref Range   Hgb A1c MFr Bld 9.8 (H) 4.8 - 5.6 %    Comment:          Prediabetes: 5.7 - 6.4          Diabetes: >6.4          Glycemic control for adults with diabetes: <7.0    Est. average glucose Bld gHb Est-mCnc 235 mg/dL  [id number]     Status: Abnormal   Collection Time: 06/11/23 10:18 AM  Result Value Ref Range   Glucose 219 (H) 70 - 99 mg/dL   BUN 14 6 - 24 mg/dL   Creatinine, Ser 5.62 0.57 - 1.00 mg/dL   eGFR 95 >13 YQ/MVH/8.46   BUN/Creatinine Ratio 19 9 - 23   Sodium 142 134 - 144 mmol/L   Potassium 4.6 3.5 -  5.2 mmol/L   Chloride 106 96 - 106 mmol/L   CO2 22 20 - 29 mmol/L   Calcium 9.8 8.7 - 10.2 mg/dL   Total Protein 6.8 6.0 - 8.5 g/dL   Albumin 4.4 3.8 - 4.9 g/dL   Globulin, Total 2.4 1.5 - 4.5 g/dL   Bilirubin Total 0.5 0.0 - 1.2 mg/dL   Alkaline Phosphatase 64 44 - 121 IU/L   AST 31 0 - 40 IU/L   ALT 64 (H) 0 - 32 IU/L  CBC with Differential/Platelet     Status: Abnormal   Collection Time: 06/11/23 10:18 AM  Result Value Ref Range   WBC 4.8 3.4 - 10.8 x10E3/uL    Comment: **Effective June 21, 2023 profile [id number] WBC will be made**   non-orderable as a stand-alone order code.    RBC 4.89 3.77 - 5.28 x10E6/uL   Hemoglobin 14.9 11.1 - 15.9 g/dL   Hematocrit 40.1 (H) 02.7 - 46.6 %   MCV 96 79 - 97 fL   MCH 30.5 26.6 - 33.0 pg   MCHC 31.8 31.5 - 35.7 g/dL   RDW 25.3 66.4 - 40.3 %   Platelets 228 150 - 450 x10E3/uL   Neutrophils 69 Not Estab. %   Lymphs 23 Not Estab. %   Monocytes 6 Not Estab. %   Eos 1 Not Estab. %   Basos  1 Not Estab. %   Neutrophils Absolute 3.4 1.4 - 7.0 x10E3/uL   Lymphocytes Absolute 1.1 0.7 - 3.1 x10E3/uL   Monocytes Absolute 0.3 0.1 - 0.9 x10E3/uL   EOS (ABSOLUTE) 0.1 0.0 - 0.4 x10E3/uL   Basophils Absolute 0.0 0.0 - 0.2 x10E3/uL   Immature Granulocytes 0 Not Estab. %   Immature Grans (Abs) 0.0 0.0 - 0.1 x10E3/uL  POCT Glucose (CBG)     Status: Abnormal   Collection Time: 06/14/23  9:40 AM  Result Value Ref Range   POC Glucose 208 (A) 70 - 99 mg/dl       Assessment & Plan:   Problem List Items Addressed This Visit       Active Problems   Type 2 diabetes mellitus with hyperglycemia (HCC) - Primary    Checking labs today. Will call pt. With results  Continue current diabetes POC, as patient has been well controlled on current regimen.  Will adjust meds if needed based on labs.       Relevant Medications   Dulaglutide (TRULICITY) 3 MG/0.5ML SOAJ   Other Relevant Orders   POCT Glucose (CBG) (Completed)   CMP14+EGFR   Hemoglobin A1c   CBC with Diff   Mixed hyperlipidemia    Checking labs today.  Continue current therapy for lipid control. Will modify as needed based on labwork results.       Relevant Orders   Lipid panel   CMP14+EGFR   CBC with Diff   Essential hypertension, benign    Blood pressure well controlled with current medications.  Continue current therapy.  Will reassess at follow up.       Relevant Orders   CMP14+EGFR   CBC with Diff   Other Visit Diagnoses     Flu vaccine need       Flu vaccine given in office today.  Patient encouraged to manage symptoms as needed with supportive measures.   Relevant Orders   CMP14+EGFR   CBC with Diff   Flu vaccine greater than or equal to 3yo with preservative IM (Completed)   Screening for HIV without presence of  risk factors       Test ordered in office today. Will call with results.   Relevant Orders   CMP14+EGFR   CBC with Diff   HIV antibody (with reflex)   Need for hepatitis C screening test        Test ordered in office today. Will call with results.   Relevant Orders   CMP14+EGFR   CBC with Diff   Hepatitis C Ab reflex to Quant PCR   Vitamin D deficiency, unspecified       Checking labs today.  Will continue supplements as needed.   Relevant Orders   VITAMIN D 25 Hydroxy (Vit-D Deficiency, Fractures)   CMP14+EGFR   CBC with Diff   Hypothyroidism (acquired)       Adding TSH to labs next time for draw. Will address at follow-up   Relevant Orders   CMP14+EGFR   CBC with Diff   Other fatigue       Relevant Orders   CMP14+EGFR   TSH   CBC with Diff   B12 deficiency due to diet       Checking labs today.  Will continue supplements as needed.   Relevant Orders   CMP14+EGFR   Vitamin B12   CBC with Diff       Return in about 3 months (around 09/14/2023).   Total time spent: 30 minutes  Miki Kins, FNP  06/14/2023   This document may have been prepared by [facility] Voice Recognition software and as such may include unintentional dictation errors.

## 2023-06-14 NOTE — Assessment & Plan Note (Signed)
Checking labs today. Will call pt. With results  Continue current diabetes POC, as patient has been well controlled on current regimen.  Will adjust meds if needed based on labs.  

## 2023-07-09 DIAGNOSIS — H2513 Age-related nuclear cataract, bilateral: Secondary | ICD-10-CM | POA: Diagnosis not present

## 2023-07-09 DIAGNOSIS — E113312 Type 2 diabetes mellitus with moderate nonproliferative diabetic retinopathy with macular edema, left eye: Secondary | ICD-10-CM | POA: Diagnosis not present

## 2023-07-09 DIAGNOSIS — E113391 Type 2 diabetes mellitus with moderate nonproliferative diabetic retinopathy without macular edema, right eye: Secondary | ICD-10-CM | POA: Diagnosis not present

## 2023-07-12 ENCOUNTER — Other Ambulatory Visit: Payer: Self-pay | Admitting: Cardiovascular Disease

## 2023-07-12 ENCOUNTER — Encounter: Payer: Self-pay | Admitting: Cardiovascular Disease

## 2023-07-12 ENCOUNTER — Ambulatory Visit: Payer: 59 | Admitting: Cardiovascular Disease

## 2023-07-12 VITALS — BP 132/78 | HR 61 | Ht 63.0 in | Wt 139.0 lb

## 2023-07-12 DIAGNOSIS — E119 Type 2 diabetes mellitus without complications: Secondary | ICD-10-CM | POA: Diagnosis not present

## 2023-07-12 DIAGNOSIS — E782 Mixed hyperlipidemia: Secondary | ICD-10-CM

## 2023-07-12 DIAGNOSIS — I10 Essential (primary) hypertension: Secondary | ICD-10-CM

## 2023-07-12 DIAGNOSIS — I25810 Atherosclerosis of coronary artery bypass graft(s) without angina pectoris: Secondary | ICD-10-CM

## 2023-07-12 MED ORDER — ROSUVASTATIN CALCIUM 40 MG PO TABS: 40.0000 mg | ORAL_TABLET | Freq: Every day | ORAL | 2 refills | Status: DC

## 2023-07-12 NOTE — Progress Notes (Signed)
Cardiology Office Note   Date:  07/12/2023   ID:  Leah Hinton, DOB 01/17/1966, MRN [id number]  PCP:  Miki Kins, FNP  Cardiologist:  Adrian Blackwater, MD      History of Present Illness: Leah Hinton is a 57 y.o. female who presents for  Chief Complaint  Patient presents with   Follow-up    No complaints      Past Medical History:  Diagnosis Date   Anxiety    Coronary artery disease    Depression    Diabetes mellitus without complication (HCC)    Hypertension    Myocardial infarction (HCC)    PONV (postoperative nausea and vomiting)      Past Surgical History:  Procedure Laterality Date   ABDOMINAL HYSTERECTOMY  2012   CESAREAN SECTION     x 2   CORONARY ARTERY BYPASS GRAFT     GANGLION CYST EXCISION Left    TRIGGER FINGER RELEASE Left 07/03/2021   Procedure: RELEASE TRIGGER FINGER/A-1 PULLEY -  INCISION, TENDON SHEATH (SURG);  Surgeon: Deeann Saint, MD;  Location: [facility] ORS;  Service: Orthopedics;  Laterality: Left;     Current Outpatient Medications  Medication Sig Dispense Refill   rosuvastatin (CRESTOR) 40 MG tablet Take 1 tablet (40 mg total) by mouth daily. 30 tablet 2   acarbose (PRECOSE) 100 MG tablet Take 1 tablet (100 mg total) by mouth 3 (three) times daily. 270 tablet 3   acetaminophen (TYLENOL) 500 MG tablet Take 1,000 mg by mouth every 6 (six) hours as needed for moderate pain.     albuterol (VENTOLIN HFA) 108 (90 Base) MCG/ACT inhaler Inhale 1 puff into the lungs every 6 (six) hours as needed for shortness of breath.     aspirin EC 81 MG tablet Take 81 mg by mouth daily. Swallow whole.     citalopram (CELEXA) 40 MG tablet Take 1 tablet (40 mg total) by mouth at bedtime. 90 tablet 3   clopidogrel (PLAVIX) 75 MG tablet TAKE 1 TABLET(75 MG) BY MOUTH DAILY 90 tablet 2   Dulaglutide (TRULICITY) 3 MG/0.5ML SOAJ Inject 3 mg as directed once a week. 6 mL 1   empagliflozin (JARDIANCE) 25 MG TABS tablet Take 1 tablet (25 mg total) by mouth  daily. 90 tablet 3   ezetimibe (ZETIA) 10 MG tablet Take 1 tablet (10 mg total) by mouth daily. 90 tablet 1   glipiZIDE (GLUCOTROL XL) 10 MG 24 hr tablet Take 1 tablet (10 mg total) by mouth daily. 90 tablet 3   lisinopril (ZESTRIL) 5 MG tablet Take 1 tablet (5 mg total) by mouth daily. 90 tablet 3   metoprolol succinate (TOPROL-XL) 25 MG 24 hr tablet TAKE 1 TABLET BY MOUTH EVERY DAY 90 tablet 0   traZODone (DESYREL) 50 MG tablet TAKE 1 TABLET BY MOUTH EVERY NIGHT AT BEDTIME AS NEEDED FOR SLEEP 30 tablet 2   No current facility-administered medications for this visit.    Allergies:   Semaglutide and Metoprolol tartrate    Social History:   reports that she has never smoked. She has never been exposed to tobacco smoke. She has never used smokeless tobacco. She reports that she does not drink alcohol and does not use drugs.   Family History:  family history includes Breast cancer (age of onset: 3) in her maternal grandmother.    ROS:     Review of Systems  Constitutional: Negative.   HENT: Negative.    Eyes: Negative.   Respiratory:  Negative.    Gastrointestinal: Negative.   Genitourinary: Negative.   Musculoskeletal: Negative.   Skin: Negative.   Neurological: Negative.   Endo/Heme/Allergies: Negative.   Psychiatric/Behavioral: Negative.    All other systems reviewed and are negative.     All other systems are reviewed and negative.    PHYSICAL EXAM: VS:  BP 132/78   Pulse 61   Ht 5\' 3"  (1.6 m)   Wt 139 lb (63 kg)   SpO2 99%   BMI 24.62 kg/m  , BMI Body mass index is 24.62 kg/m. Last weight:  Wt Readings from Last 3 Encounters:  07/12/23 139 lb (63 kg)  06/14/23 139 lb 9.6 oz (63.3 kg)  04/13/23 142 lb 9.6 oz (64.7 kg)     Physical Exam Constitutional:      Appearance: Normal appearance.  Cardiovascular:     Rate and Rhythm: Normal rate and regular rhythm.     Heart sounds: Normal heart sounds.  Pulmonary:     Effort: Pulmonary effort is normal.      Breath sounds: Normal breath sounds.  Musculoskeletal:     Right lower leg: No edema.     Left lower leg: No edema.  Neurological:     Mental Status: She is alert.       EKG:   Recent Labs: 12/03/2022: TSH 2.340 06/11/2023: ALT 64; BUN 14; Creatinine, Ser 0.73; Hemoglobin 14.9; Platelets 228; Potassium 4.6; Sodium 142    Lipid Panel    Component Value Date/Time   CHOL 123 06/11/2023 1018   TRIG 115 06/11/2023 1018   HDL 38 (L) 06/11/2023 1018   CHOLHDL 3.2 06/11/2023 1018   LDLCALC 64 06/11/2023 1018      Other studies Reviewed: Additional studies/ records that were reviewed today include:  Review of the above records demonstrates:       No data to display            ASSESSMENT AND PLAN:    ICD-10-CM   1. Diabetes mellitus without complication (HCC)  E11.9 rosuvastatin (CRESTOR) 40 MG tablet    2. Mixed hyperlipidemia  E78.2 rosuvastatin (CRESTOR) 40 MG tablet   11/24, LDL 64, needs to be 55 as had CABG, and will change lipitor to crestor 40    3. Coronary artery disease involving coronary bypass graft of native heart without angina pectoris  I25.810 rosuvastatin (CRESTOR) 40 MG tablet   has been stable, no chest pain    4. Essential hypertension, benign  I10 rosuvastatin (CRESTOR) 40 MG tablet       Problem List Items Addressed This Visit       Cardiovascular and Mediastinum   Coronary artery disease   Relevant Medications   rosuvastatin (CRESTOR) 40 MG tablet   Essential hypertension, benign   Relevant Medications   rosuvastatin (CRESTOR) 40 MG tablet     Other   Mixed hyperlipidemia   Relevant Medications   rosuvastatin (CRESTOR) 40 MG tablet   Other Visit Diagnoses       Diabetes mellitus without complication (HCC)    -  Primary   Relevant Medications   rosuvastatin (CRESTOR) 40 MG tablet          Disposition:   Return in about 3 months (around 10/10/2023).    Total time spent: 30 minutes  Signed,  Adrian Blackwater, MD   07/12/2023 10:27 AM    [facility]

## 2023-08-15 ENCOUNTER — Other Ambulatory Visit: Payer: Self-pay | Admitting: Family

## 2023-08-17 ENCOUNTER — Other Ambulatory Visit: Payer: Self-pay | Admitting: Cardiovascular Disease

## 2023-08-17 DIAGNOSIS — I340 Nonrheumatic mitral (valve) insufficiency: Secondary | ICD-10-CM

## 2023-09-13 ENCOUNTER — Other Ambulatory Visit: Payer: 59

## 2023-09-13 DIAGNOSIS — Z114 Encounter for screening for human immunodeficiency virus [HIV]: Secondary | ICD-10-CM

## 2023-09-13 DIAGNOSIS — R5383 Other fatigue: Secondary | ICD-10-CM

## 2023-09-13 DIAGNOSIS — E782 Mixed hyperlipidemia: Secondary | ICD-10-CM

## 2023-09-13 DIAGNOSIS — E039 Hypothyroidism, unspecified: Secondary | ICD-10-CM

## 2023-09-13 DIAGNOSIS — I10 Essential (primary) hypertension: Secondary | ICD-10-CM

## 2023-09-13 DIAGNOSIS — Z23 Encounter for immunization: Secondary | ICD-10-CM

## 2023-09-13 DIAGNOSIS — E559 Vitamin D deficiency, unspecified: Secondary | ICD-10-CM

## 2023-09-13 DIAGNOSIS — Z794 Long term (current) use of insulin: Secondary | ICD-10-CM

## 2023-09-13 DIAGNOSIS — E538 Deficiency of other specified B group vitamins: Secondary | ICD-10-CM

## 2023-09-13 DIAGNOSIS — Z1159 Encounter for screening for other viral diseases: Secondary | ICD-10-CM

## 2023-09-14 ENCOUNTER — Encounter: Payer: Self-pay | Admitting: Family

## 2023-09-14 ENCOUNTER — Ambulatory Visit (INDEPENDENT_AMBULATORY_CARE_PROVIDER_SITE_OTHER): Payer: 59 | Admitting: Family

## 2023-09-14 VITALS — BP 116/76 | HR 62 | Ht 63.0 in | Wt 134.2 lb

## 2023-09-14 DIAGNOSIS — E782 Mixed hyperlipidemia: Secondary | ICD-10-CM

## 2023-09-14 DIAGNOSIS — E1165 Type 2 diabetes mellitus with hyperglycemia: Secondary | ICD-10-CM | POA: Diagnosis not present

## 2023-09-14 DIAGNOSIS — I10 Essential (primary) hypertension: Secondary | ICD-10-CM | POA: Diagnosis not present

## 2023-09-14 DIAGNOSIS — J069 Acute upper respiratory infection, unspecified: Secondary | ICD-10-CM

## 2023-09-14 LAB — LIPID PANEL
Chol/HDL Ratio: 4.3 ratio (ref 0.0–4.4)
Cholesterol, Total: 149 mg/dL (ref 100–199)
HDL: 35 mg/dL — ABNORMAL LOW (ref >39)
LDL Chol Calc (NIH): 93 mg/dL (ref 0–99)
Triglycerides: 115 mg/dL (ref 0–149)
VLDL Cholesterol Cal: 21 mg/dL (ref 5–40)

## 2023-09-14 LAB — CBC WITH DIFFERENTIAL/PLATELET
Basophils Absolute: 0.1 10*3/uL (ref 0.0–0.2)
Basos: 1 %
EOS (ABSOLUTE): 0.1 10*3/uL (ref 0.0–0.4)
Eos: 2 %
Hematocrit: 41.1 % (ref 34.0–46.6)
Hemoglobin: 13.8 g/dL (ref 11.1–15.9)
Immature Grans (Abs): 0 10*3/uL (ref 0.0–0.1)
Immature Granulocytes: 0 %
Lymphocytes Absolute: 1.1 10*3/uL (ref 0.7–3.1)
Lymphs: 28 %
MCH: 31.8 pg (ref 26.6–33.0)
MCHC: 33.6 g/dL (ref 31.5–35.7)
MCV: 95 fL (ref 79–97)
Monocytes Absolute: 0.3 10*3/uL (ref 0.1–0.9)
Monocytes: 7 %
Neutrophils Absolute: 2.4 10*3/uL (ref 1.4–7.0)
Neutrophils: 62 %
Platelets: 260 10*3/uL (ref 150–450)
RBC: 4.34 x10E6/uL (ref 3.77–5.28)
RDW: 12.3 % (ref 11.7–15.4)
WBC: 4 10*3/uL (ref 3.4–10.8)

## 2023-09-14 LAB — CMP14+EGFR
ALT: 28 IU/L (ref 0–32)
AST: 21 IU/L (ref 0–40)
Albumin: 4.4 g/dL (ref 3.8–4.9)
Alkaline Phosphatase: 84 IU/L (ref 44–121)
BUN/Creatinine Ratio: 9 (ref 9–23)
BUN: 14 mg/dL (ref 6–24)
Bilirubin Total: 0.5 mg/dL (ref 0.0–1.2)
CO2: 25 mmol/L (ref 20–29)
Calcium: 9.5 mg/dL (ref 8.7–10.2)
Chloride: 102 mmol/L (ref 96–106)
Creatinine, Ser: 1.55 mg/dL — ABNORMAL HIGH (ref 0.57–1.00)
Globulin, Total: 2.7 g/dL (ref 1.5–4.5)
Glucose: 116 mg/dL — ABNORMAL HIGH (ref 70–99)
Potassium: 3.9 mmol/L (ref 3.5–5.2)
Sodium: 140 mmol/L (ref 134–144)
Total Protein: 7.1 g/dL (ref 6.0–8.5)
eGFR: 39 mL/min/{1.73_m2} — ABNORMAL LOW (ref >59)

## 2023-09-14 LAB — HCV INTERPRETATION

## 2023-09-14 LAB — HEMOGLOBIN A1C
Est. average glucose Bld gHb Est-mCnc: 214 mg/dL
Hgb A1c MFr Bld: 9.1 % — ABNORMAL HIGH (ref 4.8–5.6)

## 2023-09-14 LAB — TSH: TSH: 1.95 u[IU]/mL (ref 0.450–4.500)

## 2023-09-14 LAB — HIV ANTIBODY (ROUTINE TESTING W REFLEX): HIV Screen 4th Generation wRfx: NONREACTIVE

## 2023-09-14 LAB — HCV AB W REFLEX TO QUANT PCR: HCV Ab: NONREACTIVE

## 2023-09-14 LAB — VITAMIN B12: Vitamin B-12: 333 pg/mL (ref 232–1245)

## 2023-09-14 LAB — VITAMIN D 25 HYDROXY (VIT D DEFICIENCY, FRACTURES): Vit D, 25-Hydroxy: 38.9 ng/mL (ref 30.0–100.0)

## 2023-09-14 MED ORDER — AZITHROMYCIN 250 MG PO TABS: ORAL_TABLET | ORAL | 0 refills | Status: AC

## 2023-09-14 MED ORDER — TRULICITY 4.5 MG/0.5ML ~~LOC~~ SOAJ: 4.5000 mg | SUBCUTANEOUS | 3 refills | Status: DC

## 2023-09-14 NOTE — Progress Notes (Signed)
 Established Patient Office Visit  Subjective:  Patient ID: Leah Hinton, female    DOB: 01/18/1966  Age: 58 y.o. MRN: [id number]  Chief Complaint  Patient presents with   Follow-up    3 month follow up    Patient is here today for her 3 months follow up.  She has been feeling well since last appointment.   She does have additional concerns to discuss today.  Labs were done previously, so we will review these in detail today. She needs refills.   I have reviewed her active problem list, medication list, allergies, health maintenance, notes from last encounter, lab results for her appointment today.      No other concerns at this time.   Past Medical History:  Diagnosis Date   Anxiety    Coronary artery disease    Depression    Diabetes mellitus without complication (HCC)    Hypertension    Myocardial infarction (HCC)    PONV (postoperative nausea and vomiting)     Past Surgical History:  Procedure Laterality Date   ABDOMINAL HYSTERECTOMY  2012   CESAREAN SECTION     x 2   CORONARY ARTERY BYPASS GRAFT     GANGLION CYST EXCISION Left    TRIGGER FINGER RELEASE Left 07/03/2021   Procedure: RELEASE TRIGGER FINGER/A-1 PULLEY -  INCISION, TENDON SHEATH (SURG);  Surgeon: Marlynn Singer, MD;  Location: [facility] ORS;  Service: Orthopedics;  Laterality: Left;    Social History   Socioeconomic History   Marital status: Widowed    Spouse name: Not on file   Number of children: Not on file   Years of education: Not on file   Highest education level: Not on file  Occupational History   Not on file  Tobacco Use   Smoking status: Never    Passive exposure: Never   Smokeless tobacco: Never  Vaping Use   Vaping status: Never Used  Substance and Sexual Activity   Alcohol use: Never   Drug use: Never   Sexual activity: Not on file  Other Topics Concern   Not on file  Social History Narrative   Lives with mother and step dad.   Social Drivers of Research scientist (physical sciences) Strain: Not on file  Food Insecurity: Not on file  Transportation Needs: Not on file  Physical Activity: Not on file  Stress: Not on file  Social Connections: Not on file  Intimate Partner Violence: Not on file    Family History  Problem Relation Age of Onset   Breast cancer Maternal Grandmother 13    Allergies  Allergen Reactions   Semaglutide Nausea And Vomiting   Metoprolol  Tartrate     Headaches, tolerates metoprolol  succinate     Review of Systems  All other systems reviewed and are negative.      Objective:   BP 116/76   Pulse 62   Ht 5\' 3"  (1.6 m)   Wt 134 lb 3.2 oz (60.9 kg)   SpO2 99%   BMI 23.77 kg/m   Vitals:   09/14/23 0950  BP: 116/76  Pulse: 62  Height: 5\' 3"  (1.6 m)  Weight: 134 lb 3.2 oz (60.9 kg)  SpO2: 99%  BMI (Calculated): 23.78    Physical Exam Vitals and nursing note reviewed.  Constitutional:      Appearance: Normal appearance. She is normal weight.  HENT:     Head: Normocephalic.  Eyes:     Extraocular Movements: Extraocular movements intact.  Conjunctiva/sclera: Conjunctivae normal.     Pupils: Pupils are equal, round, and reactive to light.  Cardiovascular:     Rate and Rhythm: Normal rate.  Pulmonary:     Effort: Pulmonary effort is normal.  Neurological:     General: No focal deficit present.     Mental Status: She is alert and oriented to person, place, and time. Mental status is at baseline.  Psychiatric:        Mood and Affect: Mood normal.        Behavior: Behavior normal.        Thought Content: Thought content normal.        Judgment: Judgment normal.      No results found for any visits on 09/14/23.  Recent Results (from the past 2160 hours)  Hepatitis C Ab reflex to Quant PCR     Status: None   Collection Time: 09/13/23  9:21 AM  Result Value Ref Range   HCV Ab Non Reactive Non Reactive  HIV antibody (with reflex)     Status: None   Collection Time: 09/13/23  9:21 AM  Result Value Ref  Range   HIV Screen 4th Generation wRfx Non Reactive Non Reactive    Comment: HIV-1/HIV-2 antibodies and HIV-1 p24 antigen were NOT detected. There is no laboratory evidence of HIV infection. HIV Negative   CBC with Diff     Status: None   Collection Time: 09/13/23  9:21 AM  Result Value Ref Range   WBC 4.0 3.4 - 10.8 x10E3/uL   RBC 4.34 3.77 - 5.28 x10E6/uL   Hemoglobin 13.8 11.1 - 15.9 g/dL   Hematocrit 16.1 09.6 - 46.6 %   MCV 95 79 - 97 fL   MCH 31.8 26.6 - 33.0 pg   MCHC 33.6 31.5 - 35.7 g/dL   RDW 04.5 40.9 - 81.1 %   Platelets 260 150 - 450 x10E3/uL   Neutrophils 62 Not Estab. %   Lymphs 28 Not Estab. %   Monocytes 7 Not Estab. %   Eos 2 Not Estab. %   Basos 1 Not Estab. %   Neutrophils Absolute 2.4 1.4 - 7.0 x10E3/uL   Lymphocytes Absolute 1.1 0.7 - 3.1 x10E3/uL   Monocytes Absolute 0.3 0.1 - 0.9 x10E3/uL   EOS (ABSOLUTE) 0.1 0.0 - 0.4 x10E3/uL   Basophils Absolute 0.1 0.0 - 0.2 x10E3/uL   Immature Granulocytes 0 Not Estab. %   Immature Grans (Abs) 0.0 0.0 - 0.1 x10E3/uL  Vitamin B12     Status: None   Collection Time: 09/13/23  9:21 AM  Result Value Ref Range   Vitamin B-12 333 232 - 1,245 pg/mL  Hemoglobin A1c     Status: Abnormal   Collection Time: 09/13/23  9:21 AM  Result Value Ref Range   Hgb A1c MFr Bld 9.1 (H) 4.8 - 5.6 %    Comment:          Prediabetes: 5.7 - 6.4          Diabetes: >6.4          Glycemic control for adults with diabetes: <7.0    Est. average glucose Bld gHb Est-mCnc 214 mg/dL  TSH     Status: None   Collection Time: 09/13/23  9:21 AM  Result Value Ref Range   TSH 1.950 0.450 - 4.500 uIU/mL  CMP14+EGFR     Status: Abnormal   Collection Time: 09/13/23  9:21 AM  Result Value Ref Range   Glucose 116 (H)  70 - 99 mg/dL   BUN 14 6 - 24 mg/dL   Creatinine, Ser 0.98 (H) 0.57 - 1.00 mg/dL   eGFR 39 (L) >11 BJ/YNW/2.95   BUN/Creatinine Ratio 9 9 - 23   Sodium 140 134 - 144 mmol/L   Potassium 3.9 3.5 - 5.2 mmol/L   Chloride 102 96 - 106  mmol/L   CO2 25 20 - 29 mmol/L   Calcium  9.5 8.7 - 10.2 mg/dL   Total Protein 7.1 6.0 - 8.5 g/dL   Albumin 4.4 3.8 - 4.9 g/dL   Globulin, Total 2.7 1.5 - 4.5 g/dL   Bilirubin Total 0.5 0.0 - 1.2 mg/dL   Alkaline Phosphatase 84 44 - 121 IU/L   AST 21 0 - 40 IU/L   ALT 28 0 - 32 IU/L  VITAMIN D  25 Hydroxy (Vit-D Deficiency, Fractures)     Status: None   Collection Time: 09/13/23  9:21 AM  Result Value Ref Range   Vit D, 25-Hydroxy 38.9 30.0 - 100.0 ng/mL    Comment: Vitamin D  deficiency has been defined by the Institute of Medicine and an Endocrine Society practice guideline as a level of serum 25-OH vitamin D  less than 20 ng/mL (1,2). The Endocrine Society went on to further define vitamin D  insufficiency as a level between 21 and 29 ng/mL (2). 1. IOM (Institute of Medicine). 2010. Dietary reference    intakes for calcium  and D. [place]: The    [employer]. 2. Holick MF, [place], Bischoff-Ferrari HA, et al.    Evaluation, treatment, and prevention of vitamin D     deficiency: an Endocrine Society clinical practice    guideline. JCEM. 2011 Jul; 96(7):1911-30.   Lipid panel     Status: Abnormal   Collection Time: 09/13/23  9:21 AM  Result Value Ref Range   Cholesterol, Total 149 100 - 199 mg/dL   Triglycerides 621 0 - 149 mg/dL   HDL 35 (L) >30 mg/dL   VLDL Cholesterol Cal 21 5 - 40 mg/dL   LDL Chol Calc (NIH) 93 0 - 99 mg/dL   Chol/HDL Ratio 4.3 0.0 - 4.4 ratio    Comment:                                   T. Chol/HDL Ratio                                             Men  Women                               1/2 Avg.Risk  3.4    3.3                                   Avg.Risk  5.0    4.4                                2X Avg.Risk  9.6    7.1  3X Avg.Risk 23.4   11.0   Interpretation:     Status: None   Collection Time: 09/13/23  9:21 AM  Result Value Ref Range   HCV Interp 1: Comment     Comment: Not infected with HCV  unless early or acute infection is suspected (which may be delayed in an immunocompromised individual), or other evidence exists to indicate HCV infection.        Assessment & Plan:   Problem List Items Addressed This Visit       Cardiovascular and Mediastinum   Essential hypertension, benign   Blood pressure well controlled with current medications.  Continue current therapy.  Will reassess at follow up.          Endocrine   Type 2 diabetes mellitus with hyperglycemia (HCC) - Primary   Increasing Trulicity , as it has been helpful, but patient needs further reduction of A1C.  New RX sent to the pharmacy.   Will reassess at follow up.         Relevant Medications   Dulaglutide  (TRULICITY ) 4.5 MG/0.5ML SOAJ     Other   Mixed hyperlipidemia   Continue current therapy for lipid control. Will modify as needed based on labwork results.        Other Visit Diagnoses       Acute upper respiratory infection       sending antibiotics for patient. She will let me know if these don't improve her symptoms.       Return in about 3 months (around 12/12/2023).   Total time spent: 20 minutes  Trenda Frisk, FNP  09/14/2023   This document may have been prepared by [facility] Voice Recognition software and as such may include unintentional dictation errors.

## 2023-09-16 ENCOUNTER — Ambulatory Visit
Admission: RE | Admit: 2023-09-16 | Discharge: 2023-09-16 | Disposition: A | Discharge status: Home / Self Care | Payer: Medicaid Other | Source: Ambulatory Visit | Attending: Cardiology | Admitting: Cardiology

## 2023-09-16 ENCOUNTER — Encounter: Payer: Self-pay | Admitting: Cardiology

## 2023-09-16 ENCOUNTER — Other Ambulatory Visit: Payer: Self-pay

## 2023-09-16 ENCOUNTER — Ambulatory Visit (INDEPENDENT_AMBULATORY_CARE_PROVIDER_SITE_OTHER): Payer: 59 | Admitting: Cardiology

## 2023-09-16 VITALS — BP 118/60 | HR 77 | Ht 63.0 in | Wt 131.0 lb

## 2023-09-16 DIAGNOSIS — M542 Cervicalgia: Secondary | ICD-10-CM | POA: Diagnosis present

## 2023-09-16 DIAGNOSIS — E119 Type 2 diabetes mellitus without complications: Secondary | ICD-10-CM

## 2023-09-16 DIAGNOSIS — Z0130 Encounter for examination of blood pressure without abnormal findings: Secondary | ICD-10-CM

## 2023-09-16 MED ORDER — LISINOPRIL 5 MG PO TABS: 5.0000 mg | ORAL_TABLET | Freq: Every day | ORAL | 3 refills | Status: DC

## 2023-09-16 MED ORDER — KETOROLAC TROMETHAMINE 10 MG PO TABS: 10.0000 mg | ORAL_TABLET | Freq: Four times a day (QID) | ORAL | 0 refills | Status: DC | PRN

## 2023-09-16 MED ORDER — ACARBOSE 100 MG PO TABS: 100.0000 mg | ORAL_TABLET | Freq: Three times a day (TID) | ORAL | 3 refills | Status: DC

## 2023-09-16 MED ORDER — GLIPIZIDE ER 10 MG PO TB24: 10.0000 mg | ORAL_TABLET | Freq: Every day | ORAL | 3 refills | Status: DC

## 2023-09-16 MED ORDER — CITALOPRAM HYDROBROMIDE 40 MG PO TABS
40.0000 mg | ORAL_TABLET | Freq: Every day | ORAL | 3 refills | Status: AC
Start: 2023-09-16 — End: ?

## 2023-09-16 MED ORDER — ACARBOSE 100 MG PO TABS: 100.0000 mg | ORAL_TABLET | Freq: Three times a day (TID) | ORAL | 3 refills | Status: AC

## 2023-09-16 MED ORDER — CITALOPRAM HYDROBROMIDE 40 MG PO TABS: 40.0000 mg | ORAL_TABLET | Freq: Every day | ORAL | 3 refills | Status: DC

## 2023-09-16 MED ORDER — BACLOFEN 10 MG PO TABS: 10.0000 mg | ORAL_TABLET | Freq: Every day | ORAL | 1 refills | Status: DC

## 2023-09-16 NOTE — Progress Notes (Signed)
 Established Patient Office Visit  Subjective:  Patient ID: Leah Hinton, female    DOB: 02-09-1966  Age: 58 y.o. MRN: [id number]  Chief Complaint  Patient presents with   Acute Visit    Neck Pain    Patient in office for an acute visit, complaining of right sided neck pain. Patient had a similar problem in 12/2022, relieved with Celebrex and Baclofen. Patient comes in today with similar neck pain. Pain does not radiate. Denies tingling, numbness. Will get an xray to check for acute changes. Will send in Baclofen, Toradol.   Neck Pain  This is a recurrent problem. The current episode started in the past 7 days. The problem occurs constantly. The problem has been unchanged. The pain is associated with an unknown factor. The pain is present in the right side. The quality of the pain is described as aching. The pain is at a severity of 10/10. The pain is severe. The symptoms are aggravated by position. The pain is Same all the time. Pertinent negatives include no chest pain, headaches, numbness or visual change. She has tried acetaminophen (Bio freeze) for the symptoms. The treatment provided no relief.    No other concerns at this time.   Past Medical History:  Diagnosis Date   Anxiety    Coronary artery disease    Depression    Diabetes mellitus without complication (HCC)    Hypertension    Myocardial infarction (HCC)    PONV (postoperative nausea and vomiting)     Past Surgical History:  Procedure Laterality Date   ABDOMINAL HYSTERECTOMY  2012   CESAREAN SECTION     x 2   CORONARY ARTERY BYPASS GRAFT     GANGLION CYST EXCISION Left    TRIGGER FINGER RELEASE Left 07/03/2021   Procedure: RELEASE TRIGGER FINGER/A-1 PULLEY -  INCISION, TENDON SHEATH (SURG);  Surgeon: Deeann Saint, MD;  Location: [facility] ORS;  Service: Orthopedics;  Laterality: Left;    Social History   Socioeconomic History   Marital status: Widowed    Spouse name: Not on file   Number of children: Not on  file   Years of education: Not on file   Highest education level: Not on file  Occupational History   Not on file  Tobacco Use   Smoking status: Never    Passive exposure: Never   Smokeless tobacco: Never  Vaping Use   Vaping status: Never Used  Substance and Sexual Activity   Alcohol use: Never   Drug use: Never   Sexual activity: Not on file  Other Topics Concern   Not on file  Social History Narrative   Lives with mother and step dad.   Social Drivers of Corporate investment banker Strain: Not on file  Food Insecurity: Not on file  Transportation Needs: Not on file  Physical Activity: Not on file  Stress: Not on file  Social Connections: Not on file  Intimate Partner Violence: Not on file    Family History  Problem Relation Age of Onset   Breast cancer Maternal Grandmother 60    Allergies  Allergen Reactions   Semaglutide Nausea And Vomiting   Metoprolol Tartrate     Headaches, tolerates metoprolol succinate     Outpatient Medications Prior to Visit  Medication Sig   acetaminophen (TYLENOL) 500 MG tablet Take 1,000 mg by mouth every 6 (six) hours as needed for moderate pain.   albuterol (VENTOLIN HFA) 108 (90 Base) MCG/ACT inhaler Inhale 1 puff into  the lungs every 6 (six) hours as needed for shortness of breath.   aspirin EC 81 MG tablet Take 81 mg by mouth daily. Swallow whole.   azithromycin (ZITHROMAX) 250 MG tablet Take 2 tablets (500 mg total) by mouth daily for 1 day, THEN 1 tablet (250 mg total) daily for 4 days.   clopidogrel (PLAVIX) 75 MG tablet TAKE 1 TABLET(75 MG) BY MOUTH DAILY   Dulaglutide (TRULICITY) 3 MG/0.5ML SOAJ Inject 3 mg as directed once a week.   Dulaglutide (TRULICITY) 4.5 MG/0.5ML SOAJ Inject 4.5 mg as directed once a week.   empagliflozin (JARDIANCE) 25 MG TABS tablet Take 1 tablet (25 mg total) by mouth daily.   ezetimibe (ZETIA) 10 MG tablet Take 1 tablet (10 mg total) by mouth daily.   metoprolol succinate (TOPROL-XL) 25 MG 24 hr  tablet TAKE 1 TABLET BY MOUTH EVERY DAY   rosuvastatin (CRESTOR) 40 MG tablet Take 1 tablet (40 mg total) by mouth daily.   traZODone (DESYREL) 50 MG tablet TAKE 1 TABLET BY MOUTH EVERY NIGHT AT BEDTIME AS NEEDED FOR SLEEP   [DISCONTINUED] acarbose (PRECOSE) 100 MG tablet Take 1 tablet (100 mg total) by mouth 3 (three) times daily.   [DISCONTINUED] citalopram (CELEXA) 40 MG tablet Take 1 tablet (40 mg total) by mouth at bedtime.   [DISCONTINUED] glipiZIDE (GLUCOTROL XL) 10 MG 24 hr tablet Take 1 tablet (10 mg total) by mouth daily.   [DISCONTINUED] lisinopril (ZESTRIL) 5 MG tablet Take 1 tablet (5 mg total) by mouth daily.   No facility-administered medications prior to visit.    Review of Systems  Constitutional: Negative.   HENT: Negative.    Eyes: Negative.   Respiratory: Negative.  Negative for shortness of breath.   Cardiovascular: Negative.  Negative for chest pain.  Gastrointestinal: Negative.  Negative for abdominal pain, constipation and diarrhea.  Genitourinary: Negative.   Musculoskeletal:  Positive for neck pain. Negative for joint pain and myalgias.  Skin: Negative.   Neurological: Negative.  Negative for dizziness, numbness and headaches.  Endo/Heme/Allergies: Negative.   All other systems reviewed and are negative.      Objective:   BP 118/60   Pulse 77   Ht 5\' 3"  (1.6 m)   Wt 131 lb (59.4 kg)   SpO2 96%   BMI 23.21 kg/m   Vitals:   09/16/23 1415  BP: 118/60  Pulse: 77  Height: 5\' 3"  (1.6 m)  Weight: 131 lb (59.4 kg)  SpO2: 96%  BMI (Calculated): 23.21    Physical Exam Vitals and nursing note reviewed.  Constitutional:      Appearance: Normal appearance. She is normal weight.  HENT:     Head: Normocephalic and atraumatic.     Nose: Nose normal.     Mouth/Throat:     Mouth: Mucous membranes are moist.  Eyes:     Extraocular Movements: Extraocular movements intact.     Conjunctiva/sclera: Conjunctivae normal.     Pupils: Pupils are equal,  round, and reactive to light.  Cardiovascular:     Rate and Rhythm: Normal rate and regular rhythm.     Pulses: Normal pulses.     Heart sounds: Normal heart sounds.  Pulmonary:     Effort: Pulmonary effort is normal.     Breath sounds: Normal breath sounds.  Abdominal:     General: Abdomen is flat. Bowel sounds are normal.     Palpations: Abdomen is soft.  Musculoskeletal:        General: Normal  range of motion.     Cervical back: Normal range of motion.  Skin:    General: Skin is warm and dry.  Neurological:     General: No focal deficit present.     Mental Status: She is alert and oriented to person, place, and time.  Psychiatric:        Mood and Affect: Mood normal.        Behavior: Behavior normal.        Thought Content: Thought content normal.        Judgment: Judgment normal.      No results found for any visits on 09/16/23.  Recent Results (from the past 2160 hours)  Hepatitis C Ab reflex to Quant PCR     Status: None   Collection Time: 09/13/23  9:21 AM  Result Value Ref Range   HCV Ab Non Reactive Non Reactive  HIV antibody (with reflex)     Status: None   Collection Time: 09/13/23  9:21 AM  Result Value Ref Range   HIV Screen 4th Generation wRfx Non Reactive Non Reactive    Comment: HIV-1/HIV-2 antibodies and HIV-1 p24 antigen were NOT detected. There is no laboratory evidence of HIV infection. HIV Negative   CBC with Diff     Status: None   Collection Time: 09/13/23  9:21 AM  Result Value Ref Range   WBC 4.0 3.4 - 10.8 x10E3/uL   RBC 4.34 3.77 - 5.28 x10E6/uL   Hemoglobin 13.8 11.1 - 15.9 g/dL   Hematocrit 16.1 09.6 - 46.6 %   MCV 95 79 - 97 fL   MCH 31.8 26.6 - 33.0 pg   MCHC 33.6 31.5 - 35.7 g/dL   RDW 04.5 40.9 - 81.1 %   Platelets 260 150 - 450 x10E3/uL   Neutrophils 62 Not Estab. %   Lymphs 28 Not Estab. %   Monocytes 7 Not Estab. %   Eos 2 Not Estab. %   Basos 1 Not Estab. %   Neutrophils Absolute 2.4 1.4 - 7.0 x10E3/uL   Lymphocytes  Absolute 1.1 0.7 - 3.1 x10E3/uL   Monocytes Absolute 0.3 0.1 - 0.9 x10E3/uL   EOS (ABSOLUTE) 0.1 0.0 - 0.4 x10E3/uL   Basophils Absolute 0.1 0.0 - 0.2 x10E3/uL   Immature Granulocytes 0 Not Estab. %   Immature Grans (Abs) 0.0 0.0 - 0.1 x10E3/uL  Vitamin B12     Status: None   Collection Time: 09/13/23  9:21 AM  Result Value Ref Range   Vitamin B-12 333 232 - 1,245 pg/mL  Hemoglobin A1c     Status: Abnormal   Collection Time: 09/13/23  9:21 AM  Result Value Ref Range   Hgb A1c MFr Bld 9.1 (H) 4.8 - 5.6 %    Comment:          Prediabetes: 5.7 - 6.4          Diabetes: >6.4          Glycemic control for adults with diabetes: <7.0    Est. average glucose Bld gHb Est-mCnc 214 mg/dL  TSH     Status: None   Collection Time: 09/13/23  9:21 AM  Result Value Ref Range   TSH 1.950 0.450 - 4.500 uIU/mL  CMP14+EGFR     Status: Abnormal   Collection Time: 09/13/23  9:21 AM  Result Value Ref Range   Glucose 116 (H) 70 - 99 mg/dL   BUN 14 6 - 24 mg/dL   Creatinine, Ser 9.14 (H) 0.57 -  1.00 mg/dL   eGFR 39 (L) >11 BJ/YNW/2.95   BUN/Creatinine Ratio 9 9 - 23   Sodium 140 134 - 144 mmol/L   Potassium 3.9 3.5 - 5.2 mmol/L   Chloride 102 96 - 106 mmol/L   CO2 25 20 - 29 mmol/L   Calcium 9.5 8.7 - 10.2 mg/dL   Total Protein 7.1 6.0 - 8.5 g/dL   Albumin 4.4 3.8 - 4.9 g/dL   Globulin, Total 2.7 1.5 - 4.5 g/dL   Bilirubin Total 0.5 0.0 - 1.2 mg/dL   Alkaline Phosphatase 84 44 - 121 IU/L   AST 21 0 - 40 IU/L   ALT 28 0 - 32 IU/L  VITAMIN D 25 Hydroxy (Vit-D Deficiency, Fractures)     Status: None   Collection Time: 09/13/23  9:21 AM  Result Value Ref Range   Vit D, 25-Hydroxy 38.9 30.0 - 100.0 ng/mL    Comment: Vitamin D deficiency has been defined by the Institute of Medicine and an Endocrine Society practice guideline as a level of serum 25-OH vitamin D less than 20 ng/mL (1,2). The Endocrine Society went on to further define vitamin D insufficiency as a level between 21 and 29 ng/mL  (2). 1. IOM (Institute of Medicine). 2010. Dietary reference    intakes for calcium and D. [place]: The    [employer]. 2. Holick MF, [place], Bischoff-Ferrari HA, et al.    Evaluation, treatment, and prevention of vitamin D    deficiency: an Endocrine Society clinical practice    guideline. JCEM. 2011 Jul; 96(7):1911-30.   Lipid panel     Status: Abnormal   Collection Time: 09/13/23  9:21 AM  Result Value Ref Range   Cholesterol, Total 149 100 - 199 mg/dL   Triglycerides 621 0 - 149 mg/dL   HDL 35 (L) >30 mg/dL   VLDL Cholesterol Cal 21 5 - 40 mg/dL   LDL Chol Calc (NIH) 93 0 - 99 mg/dL   Chol/HDL Ratio 4.3 0.0 - 4.4 ratio    Comment:                                   T. Chol/HDL Ratio                                             Men  Women                               1/2 Avg.Risk  3.4    3.3                                   Avg.Risk  5.0    4.4                                2X Avg.Risk  9.6    7.1                                3X Avg.Risk 23.4   11.0   Interpretation:     Status: None  Collection Time: 09/13/23  9:21 AM  Result Value Ref Range   HCV Interp 1: Comment     Comment: Not infected with HCV unless early or acute infection is suspected (which may be delayed in an immunocompromised individual), or other evidence exists to indicate HCV infection.       Assessment & Plan:  Neck xray today. Baclofen Toradol  Problem List Items Addressed This Visit       Other   Neck pain - Primary   Relevant Orders   DG Neck Soft Tissue   DG Cervical Spine Complete    Return if symptoms worsen or fail to improve.   Total time spent: 25 minutes  [employer], NP  09/16/2023   This document may have been prepared by Dragon Voice Recognition software and as such may include unintentional dictation errors.

## 2023-10-06 NOTE — Progress Notes (Signed)
 Patient notified

## 2023-10-12 ENCOUNTER — Encounter: Payer: Self-pay | Admitting: Cardiovascular Disease

## 2023-10-12 ENCOUNTER — Ambulatory Visit (INDEPENDENT_AMBULATORY_CARE_PROVIDER_SITE_OTHER): Payer: Medicaid Other | Admitting: Cardiovascular Disease

## 2023-10-12 VITALS — BP 105/71 | HR 65 | Ht 63.0 in | Wt 129.0 lb

## 2023-10-12 DIAGNOSIS — E1165 Type 2 diabetes mellitus with hyperglycemia: Secondary | ICD-10-CM

## 2023-10-12 DIAGNOSIS — E782 Mixed hyperlipidemia: Secondary | ICD-10-CM | POA: Diagnosis not present

## 2023-10-12 DIAGNOSIS — I25798 Atherosclerosis of other coronary artery bypass graft(s) with other forms of angina pectoris: Secondary | ICD-10-CM | POA: Diagnosis not present

## 2023-10-12 DIAGNOSIS — I10 Essential (primary) hypertension: Secondary | ICD-10-CM

## 2023-10-12 DIAGNOSIS — E119 Type 2 diabetes mellitus without complications: Secondary | ICD-10-CM

## 2023-10-12 DIAGNOSIS — I25810 Atherosclerosis of coronary artery bypass graft(s) without angina pectoris: Secondary | ICD-10-CM

## 2023-10-12 MED ORDER — ROSUVASTATIN CALCIUM 40 MG PO TABS: 40.0000 mg | ORAL_TABLET | Freq: Every day | ORAL | 2 refills | Status: DC

## 2023-10-12 MED ORDER — EMPAGLIFLOZIN 25 MG PO TABS: 25.0000 mg | ORAL_TABLET | Freq: Every day | ORAL | 3 refills | Status: DC

## 2023-10-12 NOTE — Progress Notes (Signed)
 Cardiology Office Note   Date:  10/12/2023   ID:  Leah Hinton, Leah Hinton 11/18/1965, MRN [id number]  PCP:  Miki Kins, FNP  Cardiologist:  Adrian Blackwater, MD      History of Present Illness: Leah Hinton is a 58 y.o. female who presents for  Chief Complaint  Patient presents with   Follow-up    3 month follow up    FEELING GOOD      Past Medical History:  Diagnosis Date   Anxiety    Coronary artery disease    Depression    Diabetes mellitus without complication (HCC)    Hypertension    Myocardial infarction (HCC)    PONV (postoperative nausea and vomiting)      Past Surgical History:  Procedure Laterality Date   ABDOMINAL HYSTERECTOMY  2012   CESAREAN SECTION     x 2   CORONARY ARTERY BYPASS GRAFT     GANGLION CYST EXCISION Left    TRIGGER FINGER RELEASE Left 07/03/2021   Procedure: RELEASE TRIGGER FINGER/A-1 PULLEY -  INCISION, TENDON SHEATH (SURG);  Surgeon: Deeann Saint, MD;  Location: [facility] ORS;  Service: Orthopedics;  Laterality: Left;     Current Outpatient Medications  Medication Sig Dispense Refill   acarbose (PRECOSE) 100 MG tablet Take 1 tablet (100 mg total) by mouth 3 (three) times daily. 270 tablet 3   acetaminophen (TYLENOL) 500 MG tablet Take 1,000 mg by mouth every 6 (six) hours as needed for moderate pain.     albuterol (VENTOLIN HFA) 108 (90 Base) MCG/ACT inhaler Inhale 1 puff into the lungs every 6 (six) hours as needed for shortness of breath.     aspirin EC 81 MG tablet Take 81 mg by mouth daily. Swallow whole.     baclofen (LIORESAL) 10 MG tablet Take 1 tablet (10 mg total) by mouth daily. 30 tablet 1   citalopram (CELEXA) 40 MG tablet Take 1 tablet (40 mg total) by mouth at bedtime. 90 tablet 3   clopidogrel (PLAVIX) 75 MG tablet TAKE 1 TABLET(75 MG) BY MOUTH DAILY 90 tablet 2   Dulaglutide (TRULICITY) 3 MG/0.5ML SOAJ Inject 3 mg as directed once a week. 6 mL 1   Dulaglutide (TRULICITY) 4.5 MG/0.5ML SOAJ Inject 4.5 mg as  directed once a week. 2 mL 3   ezetimibe (ZETIA) 10 MG tablet Take 1 tablet (10 mg total) by mouth daily. 90 tablet 1   glipiZIDE (GLUCOTROL XL) 10 MG 24 hr tablet Take 1 tablet (10 mg total) by mouth daily. 90 tablet 3   ketorolac (TORADOL) 10 MG tablet Take 1 tablet (10 mg total) by mouth every 6 (six) hours as needed. 20 tablet 0   lisinopril (ZESTRIL) 5 MG tablet Take 1 tablet (5 mg total) by mouth daily. 90 tablet 3   metoprolol succinate (TOPROL-XL) 25 MG 24 hr tablet TAKE 1 TABLET BY MOUTH EVERY DAY 90 tablet 0   traZODone (DESYREL) 50 MG tablet TAKE 1 TABLET BY MOUTH EVERY NIGHT AT BEDTIME AS NEEDED FOR SLEEP 30 tablet 2   empagliflozin (JARDIANCE) 25 MG TABS tablet Take 1 tablet (25 mg total) by mouth daily. 90 tablet 3   rosuvastatin (CRESTOR) 40 MG tablet Take 1 tablet (40 mg total) by mouth daily. 30 tablet 2   No current facility-administered medications for this visit.    Allergies:   Semaglutide and Metoprolol tartrate    Social History:   reports that she has never smoked. She has never  been exposed to tobacco smoke. She has never used smokeless tobacco. She reports that she does not drink alcohol and does not use drugs.   Family History:  family history includes Breast cancer (age of onset: 11) in her maternal grandmother.    ROS:     Review of Systems  Constitutional: Negative.   HENT: Negative.    Eyes: Negative.   Respiratory: Negative.    Gastrointestinal: Negative.   Genitourinary: Negative.   Musculoskeletal: Negative.   Skin: Negative.   Neurological: Negative.   Endo/Heme/Allergies: Negative.   Psychiatric/Behavioral: Negative.    All other systems reviewed and are negative.     All other systems are reviewed and negative.    PHYSICAL EXAM: VS:  BP 105/71   Pulse 65   Ht 5\' 3"  (1.6 m)   Wt 129 lb (58.5 kg)   SpO2 98%   BMI 22.85 kg/m  , BMI Body mass index is 22.85 kg/m. Last weight:  Wt Readings from Last 3 Encounters:  10/12/23 129 lb  (58.5 kg)  09/16/23 131 lb (59.4 kg)  09/14/23 134 lb 3.2 oz (60.9 kg)     Physical Exam Constitutional:      Appearance: Normal appearance.  Cardiovascular:     Rate and Rhythm: Normal rate and regular rhythm.     Heart sounds: Normal heart sounds.  Pulmonary:     Effort: Pulmonary effort is normal.     Breath sounds: Normal breath sounds.  Musculoskeletal:     Right lower leg: No edema.     Left lower leg: No edema.  Neurological:     Mental Status: She is alert.       EKG:   Recent Labs: 09/13/2023: ALT 28; BUN 14; Creatinine, Ser 1.55; Hemoglobin 13.8; Platelets 260; Potassium 3.9; Sodium 140; TSH 1.950    Lipid Panel    Component Value Date/Time   CHOL 149 09/13/2023 0921   TRIG 115 09/13/2023 0921   HDL 35 (L) 09/13/2023 0921   CHOLHDL 4.3 09/13/2023 0921   LDLCALC 93 09/13/2023 0921      Other studies Reviewed: Additional studies/ records that were reviewed today include:  Review of the above records demonstrates:       No data to display            ASSESSMENT AND PLAN:    ICD-10-CM   1. Coronary artery disease involving other coronary artery bypass graft with other forms of angina pectoris (HCC)  I25.798 empagliflozin (JARDIANCE) 25 MG TABS tablet    rosuvastatin (CRESTOR) 40 MG tablet   no chest pain    2. Type 2 diabetes mellitus with hyperglycemia, without long-term current use of insulin (HCC)  E11.65 empagliflozin (JARDIANCE) 25 MG TABS tablet    rosuvastatin (CRESTOR) 40 MG tablet    3. Essential hypertension, benign  I10 empagliflozin (JARDIANCE) 25 MG TABS tablet    rosuvastatin (CRESTOR) 40 MG tablet    4. Mixed hyperlipidemia  E78.2 empagliflozin (JARDIANCE) 25 MG TABS tablet    rosuvastatin (CRESTOR) 40 MG tablet    5. Mixed hyperlipidemia  E78.2 empagliflozin (JARDIANCE) 25 MG TABS tablet    rosuvastatin (CRESTOR) 40 MG tablet   11/24, LDL 64, needs to be 55 as had CABG, and will change lipitor to crestor 40    6. Coronary  artery disease involving coronary bypass graft of native heart without angina pectoris  I25.810 empagliflozin (JARDIANCE) 25 MG TABS tablet    rosuvastatin (CRESTOR) 40 MG tablet  has been stable, no chest pain    7. Diabetes mellitus without complication (HCC)  E11.9 empagliflozin (JARDIANCE) 25 MG TABS tablet    rosuvastatin (CRESTOR) 40 MG tablet       Problem List Items Addressed This Visit       Cardiovascular and Mediastinum   Coronary artery disease - Primary   Relevant Medications   empagliflozin (JARDIANCE) 25 MG TABS tablet   rosuvastatin (CRESTOR) 40 MG tablet   Essential hypertension, benign   Relevant Medications   empagliflozin (JARDIANCE) 25 MG TABS tablet   rosuvastatin (CRESTOR) 40 MG tablet     Endocrine   Type 2 diabetes mellitus with hyperglycemia (HCC)   Relevant Medications   empagliflozin (JARDIANCE) 25 MG TABS tablet   rosuvastatin (CRESTOR) 40 MG tablet     Other   Mixed hyperlipidemia   Relevant Medications   empagliflozin (JARDIANCE) 25 MG TABS tablet   rosuvastatin (CRESTOR) 40 MG tablet   Other Visit Diagnoses       Diabetes mellitus without complication (HCC)       Relevant Medications   empagliflozin (JARDIANCE) 25 MG TABS tablet   rosuvastatin (CRESTOR) 40 MG tablet          Disposition:   Return in about 3 months (around 01/12/2024).    Total time spent: 30 minutes  Signed,  Adrian Blackwater, MD  10/12/2023 11:14 AM    [facility]

## 2023-10-24 ENCOUNTER — Other Ambulatory Visit: Payer: Self-pay | Admitting: Cardiovascular Disease

## 2023-10-24 DIAGNOSIS — E782 Mixed hyperlipidemia: Secondary | ICD-10-CM

## 2023-10-25 ENCOUNTER — Ambulatory Visit: Admitting: Cardiology

## 2023-10-25 ENCOUNTER — Encounter: Payer: Self-pay | Admitting: Cardiology

## 2023-10-25 VITALS — BP 118/62 | HR 74 | Ht 63.0 in | Wt 124.0 lb

## 2023-10-25 DIAGNOSIS — E119 Type 2 diabetes mellitus without complications: Secondary | ICD-10-CM

## 2023-10-25 DIAGNOSIS — H8302 Labyrinthitis, left ear: Secondary | ICD-10-CM | POA: Insufficient documentation

## 2023-10-25 DIAGNOSIS — Z0130 Encounter for examination of blood pressure without abnormal findings: Secondary | ICD-10-CM

## 2023-10-25 MED ORDER — CIPROFLOXACIN-FLUOCINOLONE PF 0.3-0.025 % OT SOLN
0.2500 mL | Freq: Two times a day (BID) | OTIC | 0 refills | Status: DC
Start: 2023-10-25 — End: 2023-11-01

## 2023-10-25 NOTE — Progress Notes (Signed)
 Established Patient Office Visit  Subjective:  Patient ID: Leah Hinton, female    DOB: 12/22/1965  Age: 58 y.o. MRN: [id number]  Chief Complaint  Patient presents with   Acute Visit    L Ear Pain    Patient in office for an acute visit, complaining of left ear pain. Patient reports ear pain started Friday night, Saturday morning. Patient reports also having a runny nose. Recommend starting an antihistamine. Will send in an otic antibiotic.   Otalgia  There is pain in the left ear. This is a new problem. The current episode started in the past 7 days. The problem occurs hourly. The problem has been waxing and waning. There has been no fever. Associated symptoms include ear discharge and rhinorrhea. Pertinent negatives include no abdominal pain, coughing, diarrhea, headaches or sore throat. She has tried nothing for the symptoms. The treatment provided no relief.    No other concerns at this time.   Past Medical History:  Diagnosis Date   Anxiety    Coronary artery disease    Depression    Diabetes mellitus without complication (HCC)    Hypertension    Myocardial infarction (HCC)    PONV (postoperative nausea and vomiting)     Past Surgical History:  Procedure Laterality Date   ABDOMINAL HYSTERECTOMY  2012   CESAREAN SECTION     x 2   CORONARY ARTERY BYPASS GRAFT     GANGLION CYST EXCISION Left    TRIGGER FINGER RELEASE Left 07/03/2021   Procedure: RELEASE TRIGGER FINGER/A-1 PULLEY -  INCISION, TENDON SHEATH (SURG);  Surgeon: Deeann Saint, MD;  Location: [facility] ORS;  Service: Orthopedics;  Laterality: Left;    Social History   Socioeconomic History   Marital status: Widowed    Spouse name: Not on file   Number of children: Not on file   Years of education: Not on file   Highest education level: Not on file  Occupational History   Not on file  Tobacco Use   Smoking status: Never    Passive exposure: Never   Smokeless tobacco: Never  Vaping Use   Vaping  status: Never Used  Substance and Sexual Activity   Alcohol use: Never   Drug use: Never   Sexual activity: Not on file  Other Topics Concern   Not on file  Social History Narrative   Lives with mother and step dad.   Social Drivers of Corporate investment banker Strain: Not on file  Food Insecurity: Not on file  Transportation Needs: Not on file  Physical Activity: Not on file  Stress: Not on file  Social Connections: Not on file  Intimate Partner Violence: Not on file    Family History  Problem Relation Age of Onset   Breast cancer Maternal Grandmother 60    Allergies  Allergen Reactions   Semaglutide Nausea And Vomiting   Metoprolol Tartrate     Headaches, tolerates metoprolol succinate     Outpatient Medications Prior to Visit  Medication Sig   acarbose (PRECOSE) 100 MG tablet Take 1 tablet (100 mg total) by mouth 3 (three) times daily.   acetaminophen (TYLENOL) 500 MG tablet Take 1,000 mg by mouth every 6 (six) hours as needed for moderate pain.   albuterol (VENTOLIN HFA) 108 (90 Base) MCG/ACT inhaler Inhale 1 puff into the lungs every 6 (six) hours as needed for shortness of breath.   aspirin EC 81 MG tablet Take 81 mg by mouth daily. Swallow whole.  baclofen (LIORESAL) 10 MG tablet Take 1 tablet (10 mg total) by mouth daily.   citalopram (CELEXA) 40 MG tablet Take 1 tablet (40 mg total) by mouth at bedtime.   clopidogrel (PLAVIX) 75 MG tablet TAKE 1 TABLET(75 MG) BY MOUTH DAILY   Dulaglutide (TRULICITY) 3 MG/0.5ML SOAJ Inject 3 mg as directed once a week.   Dulaglutide (TRULICITY) 4.5 MG/0.5ML SOAJ Inject 4.5 mg as directed once a week.   empagliflozin (JARDIANCE) 25 MG TABS tablet Take 1 tablet (25 mg total) by mouth daily.   ezetimibe (ZETIA) 10 MG tablet TAKE 1 TABLET(10 MG) BY MOUTH DAILY   glipiZIDE (GLUCOTROL XL) 10 MG 24 hr tablet Take 1 tablet (10 mg total) by mouth daily.   ketorolac (TORADOL) 10 MG tablet Take 1 tablet (10 mg total) by mouth every 6  (six) hours as needed.   lisinopril (ZESTRIL) 5 MG tablet Take 1 tablet (5 mg total) by mouth daily.   metoprolol succinate (TOPROL-XL) 25 MG 24 hr tablet TAKE 1 TABLET BY MOUTH EVERY DAY   rosuvastatin (CRESTOR) 40 MG tablet Take 1 tablet (40 mg total) by mouth daily.   traZODone (DESYREL) 50 MG tablet TAKE 1 TABLET BY MOUTH EVERY NIGHT AT BEDTIME AS NEEDED FOR SLEEP   No facility-administered medications prior to visit.    Review of Systems  Constitutional: Negative.   HENT:  Positive for ear discharge, ear pain and rhinorrhea. Negative for congestion, sinus pain and sore throat.   Eyes: Negative.   Respiratory: Negative.  Negative for cough and shortness of breath.   Cardiovascular: Negative.  Negative for chest pain.  Gastrointestinal: Negative.  Negative for abdominal pain, constipation and diarrhea.  Genitourinary: Negative.   Musculoskeletal:  Negative for joint pain and myalgias.  Skin: Negative.   Neurological: Negative.  Negative for dizziness and headaches.  Endo/Heme/Allergies: Negative.   All other systems reviewed and are negative.      Objective:   BP 118/62   Pulse 74   Ht 5\' 3"  (1.6 m)   Wt 124 lb (56.2 kg)   SpO2 97%   BMI 21.97 kg/m   Vitals:   10/25/23 1328  BP: 118/62  Pulse: 74  Height: 5\' 3"  (1.6 m)  Weight: 124 lb (56.2 kg)  SpO2: 97%  BMI (Calculated): 21.97    Physical Exam Vitals and nursing note reviewed.  Constitutional:      Appearance: Normal appearance. She is normal weight.  HENT:     Head: Normocephalic and atraumatic.     Left Ear: Hearing normal. Drainage, swelling and tenderness present. There is no impacted cerumen. No foreign body.     Nose: Nose normal.     Mouth/Throat:     Mouth: Mucous membranes are moist.  Eyes:     Extraocular Movements: Extraocular movements intact.     Conjunctiva/sclera: Conjunctivae normal.     Pupils: Pupils are equal, round, and reactive to light.  Cardiovascular:     Rate and Rhythm:  Normal rate and regular rhythm.     Pulses: Normal pulses.     Heart sounds: Normal heart sounds.  Pulmonary:     Effort: Pulmonary effort is normal.     Breath sounds: Normal breath sounds.  Abdominal:     General: Abdomen is flat. Bowel sounds are normal.     Palpations: Abdomen is soft.  Musculoskeletal:        General: Normal range of motion.     Cervical back: Normal range of motion.  Skin:    General: Skin is warm and dry.  Neurological:     General: No focal deficit present.     Mental Status: She is alert and oriented to person, place, and time.  Psychiatric:        Mood and Affect: Mood normal.        Behavior: Behavior normal.        Thought Content: Thought content normal.        Judgment: Judgment normal.      No results found for any visits on 10/25/23.  Recent Results (from the past 2160 hours)  Hepatitis C Ab reflex to Quant PCR     Status: None   Collection Time: 09/13/23  9:21 AM  Result Value Ref Range   HCV Ab Non Reactive Non Reactive  HIV antibody (with reflex)     Status: None   Collection Time: 09/13/23  9:21 AM  Result Value Ref Range   HIV Screen 4th Generation wRfx Non Reactive Non Reactive    Comment: HIV-1/HIV-2 antibodies and HIV-1 p24 antigen were NOT detected. There is no laboratory evidence of HIV infection. HIV Negative   CBC with Diff     Status: None   Collection Time: 09/13/23  9:21 AM  Result Value Ref Range   WBC 4.0 3.4 - 10.8 x10E3/uL   RBC 4.34 3.77 - 5.28 x10E6/uL   Hemoglobin 13.8 11.1 - 15.9 g/dL   Hematocrit 62.9 52.8 - 46.6 %   MCV 95 79 - 97 fL   MCH 31.8 26.6 - 33.0 pg   MCHC 33.6 31.5 - 35.7 g/dL   RDW 41.3 24.4 - 01.0 %   Platelets 260 150 - 450 x10E3/uL   Neutrophils 62 Not Estab. %   Lymphs 28 Not Estab. %   Monocytes 7 Not Estab. %   Eos 2 Not Estab. %   Basos 1 Not Estab. %   Neutrophils Absolute 2.4 1.4 - 7.0 x10E3/uL   Lymphocytes Absolute 1.1 0.7 - 3.1 x10E3/uL   Monocytes Absolute 0.3 0.1 - 0.9  x10E3/uL   EOS (ABSOLUTE) 0.1 0.0 - 0.4 x10E3/uL   Basophils Absolute 0.1 0.0 - 0.2 x10E3/uL   Immature Granulocytes 0 Not Estab. %   Immature Grans (Abs) 0.0 0.0 - 0.1 x10E3/uL  Vitamin B12     Status: None   Collection Time: 09/13/23  9:21 AM  Result Value Ref Range   Vitamin B-12 333 232 - 1,245 pg/mL  Hemoglobin A1c     Status: Abnormal   Collection Time: 09/13/23  9:21 AM  Result Value Ref Range   Hgb A1c MFr Bld 9.1 (H) 4.8 - 5.6 %    Comment:          Prediabetes: 5.7 - 6.4          Diabetes: >6.4          Glycemic control for adults with diabetes: <7.0    Est. average glucose Bld gHb Est-mCnc 214 mg/dL  TSH     Status: None   Collection Time: 09/13/23  9:21 AM  Result Value Ref Range   TSH 1.950 0.450 - 4.500 uIU/mL  CMP14+EGFR     Status: Abnormal   Collection Time: 09/13/23  9:21 AM  Result Value Ref Range   Glucose 116 (H) 70 - 99 mg/dL   BUN 14 6 - 24 mg/dL   Creatinine, Ser 2.72 (H) 0.57 - 1.00 mg/dL   eGFR 39 (L) >53 GU/YQI/3.47   BUN/Creatinine Ratio  9 9 - 23   Sodium 140 134 - 144 mmol/L   Potassium 3.9 3.5 - 5.2 mmol/L   Chloride 102 96 - 106 mmol/L   CO2 25 20 - 29 mmol/L   Calcium 9.5 8.7 - 10.2 mg/dL   Total Protein 7.1 6.0 - 8.5 g/dL   Albumin 4.4 3.8 - 4.9 g/dL   Globulin, Total 2.7 1.5 - 4.5 g/dL   Bilirubin Total 0.5 0.0 - 1.2 mg/dL   Alkaline Phosphatase 84 44 - 121 IU/L   AST 21 0 - 40 IU/L   ALT 28 0 - 32 IU/L  VITAMIN D 25 Hydroxy (Vit-D Deficiency, Fractures)     Status: None   Collection Time: 09/13/23  9:21 AM  Result Value Ref Range   Vit D, 25-Hydroxy 38.9 30.0 - 100.0 ng/mL    Comment: Vitamin D deficiency has been defined by the Institute of Medicine and an Endocrine Society practice guideline as a level of serum 25-OH vitamin D less than 20 ng/mL (1,2). The Endocrine Society went on to further define vitamin D insufficiency as a level between 21 and 29 ng/mL (2). 1. IOM (Institute of Medicine). 2010. Dietary reference    intakes  for calcium and D. [place]: The    [employer]. 2. Holick MF, [place], Bischoff-Ferrari HA, et al.    Evaluation, treatment, and prevention of vitamin D    deficiency: an Endocrine Society clinical practice    guideline. JCEM. 2011 Jul; 96(7):1911-30.   Lipid panel     Status: Abnormal   Collection Time: 09/13/23  9:21 AM  Result Value Ref Range   Cholesterol, Total 149 100 - 199 mg/dL   Triglycerides 782 0 - 149 mg/dL   HDL 35 (L) >95 mg/dL   VLDL Cholesterol Cal 21 5 - 40 mg/dL   LDL Chol Calc (NIH) 93 0 - 99 mg/dL   Chol/HDL Ratio 4.3 0.0 - 4.4 ratio    Comment:                                   T. Chol/HDL Ratio                                             Men  Women                               1/2 Avg.Risk  3.4    3.3                                   Avg.Risk  5.0    4.4                                2X Avg.Risk  9.6    7.1                                3X Avg.Risk 23.4   11.0   Interpretation:     Status: None   Collection Time: 09/13/23  9:21 AM  Result Value Ref Range  HCV Interp 1: Comment     Comment: Not infected with HCV unless early or acute infection is suspected (which may be delayed in an immunocompromised individual), or other evidence exists to indicate HCV infection.       Assessment & Plan:  Antihistamine Otovel  Problem List Items Addressed This Visit       Nervous and Auditory   Otitis interna, left - Primary    Return if symptoms worsen or fail to improve.   Total time spent: 25 minutes  [employer], NP  10/25/2023   This document may have been prepared by Dragon Voice Recognition software and as such may include unintentional dictation errors.

## 2023-10-26 MED ORDER — CIPRO HC 0.2-1 % OT SUSP: 4.0000 [drp] | Freq: Two times a day (BID) | OTIC | 0 refills | Status: AC

## 2023-10-26 NOTE — Addendum Note (Signed)
 Addended by: Marisue Ivan on: 10/26/2023 03:39 PM   Modules accepted: Orders

## 2023-10-27 ENCOUNTER — Other Ambulatory Visit: Payer: Self-pay

## 2023-11-14 ENCOUNTER — Encounter: Payer: Self-pay | Admitting: Family

## 2023-11-14 NOTE — Assessment & Plan Note (Signed)
 Continue current therapy for lipid control. Will modify as needed based on labwork results.

## 2023-11-14 NOTE — Assessment & Plan Note (Signed)
 Blood pressure well controlled with current medications.  Continue current therapy.  Will reassess at follow up.

## 2023-11-14 NOTE — Assessment & Plan Note (Signed)
 Increasing Trulicity , as it has been helpful, but patient needs further reduction of A1C.  New RX sent to the pharmacy.   Will reassess at follow up.

## 2023-11-15 ENCOUNTER — Other Ambulatory Visit: Payer: Self-pay | Admitting: Cardiovascular Disease

## 2023-11-15 DIAGNOSIS — I340 Nonrheumatic mitral (valve) insufficiency: Secondary | ICD-10-CM

## 2023-11-26 ENCOUNTER — Other Ambulatory Visit: Payer: Self-pay | Admitting: Internal Medicine

## 2023-12-17 ENCOUNTER — Other Ambulatory Visit: Payer: Self-pay | Admitting: Cardiovascular Disease

## 2023-12-17 DIAGNOSIS — E119 Type 2 diabetes mellitus without complications: Secondary | ICD-10-CM

## 2023-12-17 DIAGNOSIS — I25810 Atherosclerosis of coronary artery bypass graft(s) without angina pectoris: Secondary | ICD-10-CM

## 2023-12-17 DIAGNOSIS — E782 Mixed hyperlipidemia: Secondary | ICD-10-CM

## 2023-12-17 DIAGNOSIS — E1165 Type 2 diabetes mellitus with hyperglycemia: Secondary | ICD-10-CM

## 2023-12-17 DIAGNOSIS — I25798 Atherosclerosis of other coronary artery bypass graft(s) with other forms of angina pectoris: Secondary | ICD-10-CM

## 2023-12-17 DIAGNOSIS — I10 Essential (primary) hypertension: Secondary | ICD-10-CM

## 2024-01-10 ENCOUNTER — Other Ambulatory Visit

## 2024-01-10 DIAGNOSIS — E782 Mixed hyperlipidemia: Secondary | ICD-10-CM

## 2024-01-10 DIAGNOSIS — E039 Hypothyroidism, unspecified: Secondary | ICD-10-CM

## 2024-01-10 DIAGNOSIS — Z1159 Encounter for screening for other viral diseases: Secondary | ICD-10-CM

## 2024-01-10 DIAGNOSIS — R5383 Other fatigue: Secondary | ICD-10-CM

## 2024-01-10 DIAGNOSIS — Z114 Encounter for screening for human immunodeficiency virus [HIV]: Secondary | ICD-10-CM

## 2024-01-10 DIAGNOSIS — E559 Vitamin D deficiency, unspecified: Secondary | ICD-10-CM

## 2024-01-10 DIAGNOSIS — E1165 Type 2 diabetes mellitus with hyperglycemia: Secondary | ICD-10-CM

## 2024-01-10 DIAGNOSIS — I10 Essential (primary) hypertension: Secondary | ICD-10-CM

## 2024-01-10 DIAGNOSIS — Z23 Encounter for immunization: Secondary | ICD-10-CM

## 2024-01-10 DIAGNOSIS — E538 Deficiency of other specified B group vitamins: Secondary | ICD-10-CM

## 2024-01-11 ENCOUNTER — Other Ambulatory Visit: Payer: Self-pay

## 2024-01-11 ENCOUNTER — Ambulatory Visit: Payer: Self-pay

## 2024-01-11 LAB — CBC WITH DIFFERENTIAL/PLATELET
Basophils Absolute: 0 10*3/uL (ref 0.0–0.2)
Basos: 1 %
EOS (ABSOLUTE): 0.1 10*3/uL (ref 0.0–0.4)
Eos: 2 %
Hematocrit: 44.5 % (ref 34.0–46.6)
Hemoglobin: 13.9 g/dL (ref 11.1–15.9)
Immature Grans (Abs): 0 10*3/uL (ref 0.0–0.1)
Immature Granulocytes: 0 %
Lymphocytes Absolute: 1.4 10*3/uL (ref 0.7–3.1)
Lymphs: 29 %
MCH: 30.1 pg (ref 26.6–33.0)
MCHC: 31.2 g/dL — ABNORMAL LOW (ref 31.5–35.7)
MCV: 96 fL (ref 79–97)
Monocytes Absolute: 0.3 10*3/uL (ref 0.1–0.9)
Monocytes: 7 %
Neutrophils Absolute: 3 10*3/uL (ref 1.4–7.0)
Neutrophils: 61 %
Platelets: 207 10*3/uL (ref 150–450)
RBC: 4.62 x10E6/uL (ref 3.77–5.28)
RDW: 13.2 % (ref 11.7–15.4)
WBC: 4.9 10*3/uL (ref 3.4–10.8)

## 2024-01-11 LAB — CMP14+EGFR
ALT: 48 IU/L — ABNORMAL HIGH (ref 0–32)
AST: 25 IU/L (ref 0–40)
Albumin: 4.1 g/dL (ref 3.8–4.9)
Alkaline Phosphatase: 53 IU/L (ref 44–121)
BUN/Creatinine Ratio: 15 (ref 9–23)
BUN: 11 mg/dL (ref 6–24)
Bilirubin Total: 0.3 mg/dL (ref 0.0–1.2)
CO2: 21 mmol/L (ref 20–29)
Calcium: 9.6 mg/dL (ref 8.7–10.2)
Chloride: 108 mmol/L — ABNORMAL HIGH (ref 96–106)
Creatinine, Ser: 0.74 mg/dL (ref 0.57–1.00)
Globulin, Total: 2.1 g/dL (ref 1.5–4.5)
Glucose: 224 mg/dL — ABNORMAL HIGH (ref 70–99)
Potassium: 4.3 mmol/L (ref 3.5–5.2)
Sodium: 141 mmol/L (ref 134–144)
Total Protein: 6.2 g/dL (ref 6.0–8.5)
eGFR: 93 mL/min/{1.73_m2} (ref >59)

## 2024-01-11 LAB — VITAMIN D 25 HYDROXY (VIT D DEFICIENCY, FRACTURES): Vit D, 25-Hydroxy: 25.3 ng/mL — ABNORMAL LOW (ref 30.0–100.0)

## 2024-01-11 LAB — LIPID PANEL
Chol/HDL Ratio: 2.7 ratio (ref 0.0–4.4)
Cholesterol, Total: 120 mg/dL (ref 100–199)
HDL: 45 mg/dL (ref >39)
LDL Chol Calc (NIH): 55 mg/dL (ref 0–99)
Triglycerides: 110 mg/dL (ref 0–149)
VLDL Cholesterol Cal: 20 mg/dL (ref 5–40)

## 2024-01-11 LAB — TSH: TSH: 2.53 u[IU]/mL (ref 0.450–4.500)

## 2024-01-11 LAB — HEMOGLOBIN A1C
Est. average glucose Bld gHb Est-mCnc: 292 mg/dL
Hgb A1c MFr Bld: 11.8 % — ABNORMAL HIGH (ref 4.8–5.6)

## 2024-01-11 LAB — VITAMIN B12: Vitamin B-12: 449 pg/mL (ref 232–1245)

## 2024-01-11 MED ORDER — TRULICITY 4.5 MG/0.5ML ~~LOC~~ SOAJ: 4.5000 mg | SUBCUTANEOUS | 3 refills | Status: DC

## 2024-01-12 ENCOUNTER — Encounter: Payer: Self-pay | Admitting: Family

## 2024-01-12 ENCOUNTER — Ambulatory Visit
Admission: RE | Admit: 2024-01-12 | Discharge: 2024-01-12 | Disposition: A | Discharge status: Home / Self Care | Source: Ambulatory Visit | Attending: Family | Admitting: Family

## 2024-01-12 ENCOUNTER — Ambulatory Visit
Admission: RE | Admit: 2024-01-12 | Discharge: 2024-01-12 | Disposition: A | Discharge status: Home / Self Care | Attending: Family | Admitting: Family

## 2024-01-12 ENCOUNTER — Ambulatory Visit: Payer: 59 | Admitting: Family

## 2024-01-12 VITALS — BP 110/78 | HR 59 | Ht 63.0 in | Wt 130.0 lb

## 2024-01-12 DIAGNOSIS — I10 Essential (primary) hypertension: Secondary | ICD-10-CM | POA: Diagnosis not present

## 2024-01-12 DIAGNOSIS — M79601 Pain in right arm: Secondary | ICD-10-CM

## 2024-01-12 DIAGNOSIS — E1165 Type 2 diabetes mellitus with hyperglycemia: Secondary | ICD-10-CM | POA: Diagnosis not present

## 2024-01-12 DIAGNOSIS — E782 Mixed hyperlipidemia: Secondary | ICD-10-CM | POA: Diagnosis not present

## 2024-01-12 DIAGNOSIS — Z794 Long term (current) use of insulin: Secondary | ICD-10-CM

## 2024-01-12 NOTE — Progress Notes (Signed)
 Established Patient Office Visit  Subjective:  Patient ID: Leah Hinton, female    DOB: 10/25/1965  Age: 58 y.o. MRN: [id number]  Chief Complaint  Patient presents with   Follow-up    Patient is here today for her 3 months follow up.  She has been feeling well since last appointment.   She does have additional concerns to discuss today.  Right arm - pulled something out of the washer, had a pain shoot down her arm from her shoulder a few weeks ago.  She says that she has tried NSAIDs, tylenol , muscle relaxers and nothing has been helpful.  She   No known injury mechanism.  Has a history of tendonitis in right elbow.    Labs are due today. She needs refills.   I have reviewed her active problem list, medication list, allergies, notes from last encounter, lab results for her appointment today.      No other concerns at this time.   Past Medical History:  Diagnosis Date   Anxiety    Coronary artery disease    Depression    Diabetes mellitus without complication (HCC)    Hypertension    Myocardial infarction (HCC)    PONV (postoperative nausea and vomiting)     Past Surgical History:  Procedure Laterality Date   ABDOMINAL HYSTERECTOMY  2012   CESAREAN SECTION     x 2   CORONARY ARTERY BYPASS GRAFT     GANGLION CYST EXCISION Left    TRIGGER FINGER RELEASE Left 07/03/2021   Procedure: RELEASE TRIGGER FINGER/A-1 PULLEY -  INCISION, TENDON SHEATH (SURG);  Surgeon: Cleotilde Barrio, MD;  Location: [facility] ORS;  Service: Orthopedics;  Laterality: Left;    Social History   Socioeconomic History   Marital status: Widowed    Spouse name: Not on file   Number of children: Not on file   Years of education: Not on file   Highest education level: Not on file  Occupational History   Not on file  Tobacco Use   Smoking status: Never    Passive exposure: Never   Smokeless tobacco: Never  Vaping Use   Vaping status: Never Used  Substance and Sexual Activity   Alcohol  use: Never   Drug use: Never   Sexual activity: Not on file  Other Topics Concern   Not on file  Social History Narrative   Lives with mother and step dad.   Social Drivers of Corporate investment banker Strain: Not on file  Food Insecurity: Not on file  Transportation Needs: Not on file  Physical Activity: Not on file  Stress: Not on file  Social Connections: Not on file  Intimate Partner Violence: Not on file    Family History  Problem Relation Age of Onset   Breast cancer Maternal Grandmother 68    Allergies  Allergen Reactions   Semaglutide Nausea And Vomiting   Metoprolol  Tartrate     Headaches, tolerates metoprolol  succinate     Review of Systems  All other systems reviewed and are negative.      Objective:   BP 110/78   Pulse (!) 59   Ht 5' 3 (1.6 m)   Wt 130 lb (59 kg)   SpO2 97%   BMI 23.03 kg/m   Vitals:   01/12/24 0946  BP: 110/78  Pulse: (!) 59  Height: 5' 3 (1.6 m)  Weight: 130 lb (59 kg)  SpO2: 97%  BMI (Calculated): 23.03    Physical  Exam Vitals and nursing note reviewed.  Constitutional:      Appearance: Normal appearance. She is normal weight.  HENT:     Head: Normocephalic.   Eyes:     Extraocular Movements: Extraocular movements intact.     Conjunctiva/sclera: Conjunctivae normal.     Pupils: Pupils are equal, round, and reactive to light.    Cardiovascular:     Rate and Rhythm: Normal rate.  Pulmonary:     Effort: Pulmonary effort is normal.   Musculoskeletal:     Right shoulder: Tenderness present. Decreased range of motion. Decreased strength.   Neurological:     General: No focal deficit present.     Mental Status: She is alert and oriented to person, place, and time. Mental status is at baseline.   Psychiatric:        Mood and Affect: Mood normal.        Behavior: Behavior normal.        Thought Content: Thought content normal.        Judgment: Judgment normal.      No results found for any visits on  01/12/24.  Recent Results (from the past 2160 hours)  CBC with Diff     Status: Abnormal   Collection Time: 01/10/24  9:16 AM  Result Value Ref Range   WBC 4.9 3.4 - 10.8 x10E3/uL   RBC 4.62 3.77 - 5.28 x10E6/uL   Hemoglobin 13.9 11.1 - 15.9 g/dL   Hematocrit 55.4 65.9 - 46.6 %   MCV 96 79 - 97 fL   MCH 30.1 26.6 - 33.0 pg   MCHC 31.2 (L) 31.5 - 35.7 g/dL   RDW 86.7 88.2 - 84.5 %   Platelets 207 150 - 450 x10E3/uL   Neutrophils 61 Not Estab. %   Lymphs 29 Not Estab. %   Monocytes 7 Not Estab. %   Eos 2 Not Estab. %   Basos 1 Not Estab. %   Neutrophils Absolute 3.0 1.4 - 7.0 x10E3/uL   Lymphocytes Absolute 1.4 0.7 - 3.1 x10E3/uL   Monocytes Absolute 0.3 0.1 - 0.9 x10E3/uL   EOS (ABSOLUTE) 0.1 0.0 - 0.4 x10E3/uL   Basophils Absolute 0.0 0.0 - 0.2 x10E3/uL   Immature Granulocytes 0 Not Estab. %   Immature Grans (Abs) 0.0 0.0 - 0.1 x10E3/uL  Vitamin B12     Status: None   Collection Time: 01/10/24  9:16 AM  Result Value Ref Range   Vitamin B-12 449 232 - 1,245 pg/mL  Hemoglobin A1c     Status: Abnormal   Collection Time: 01/10/24  9:16 AM  Result Value Ref Range   Hgb A1c MFr Bld 11.8 (H) 4.8 - 5.6 %    Comment:          Prediabetes: 5.7 - 6.4          Diabetes: >6.4          Glycemic control for adults with diabetes: <7.0    Est. average glucose Bld gHb Est-mCnc 292 mg/dL  TSH     Status: None   Collection Time: 01/10/24  9:16 AM  Result Value Ref Range   TSH 2.530 0.450 - 4.500 uIU/mL  CMP14+EGFR     Status: Abnormal   Collection Time: 01/10/24  9:16 AM  Result Value Ref Range   Glucose 224 (H) 70 - 99 mg/dL   BUN 11 6 - 24 mg/dL   Creatinine, Ser 9.25 0.57 - 1.00 mg/dL   eGFR 93 >40 fO/fpw/8.26  BUN/Creatinine Ratio 15 9 - 23   Sodium 141 134 - 144 mmol/L   Potassium 4.3 3.5 - 5.2 mmol/L   Chloride 108 (H) 96 - 106 mmol/L   CO2 21 20 - 29 mmol/L   Calcium  9.6 8.7 - 10.2 mg/dL   Total Protein 6.2 6.0 - 8.5 g/dL   Albumin 4.1 3.8 - 4.9 g/dL   Globulin, Total  2.1 1.5 - 4.5 g/dL   Bilirubin Total 0.3 0.0 - 1.2 mg/dL   Alkaline Phosphatase 53 44 - 121 IU/L   AST 25 0 - 40 IU/L   ALT 48 (H) 0 - 32 IU/L  VITAMIN D  25 Hydroxy (Vit-D Deficiency, Fractures)     Status: Abnormal   Collection Time: 01/10/24  9:16 AM  Result Value Ref Range   Vit D, 25-Hydroxy 25.3 (L) 30.0 - 100.0 ng/mL    Comment: Vitamin D  deficiency has been defined by the Institute of Medicine and an Endocrine Society practice guideline as a level of serum 25-OH vitamin D  less than 20 ng/mL (1,2). The Endocrine Society went on to further define vitamin D  insufficiency as a level between 21 and 29 ng/mL (2). 1. IOM (Institute of Medicine). 2010. Dietary reference    intakes for calcium  and D. [place]: The    [employer]. 2. Holick MF, Binkley [place], Bischoff-Ferrari HA, et al.    Evaluation, treatment, and prevention of vitamin D     deficiency: an Endocrine Society clinical practice    guideline. JCEM. 2011 Jul; 96(7):1911-30.   Lipid panel     Status: None   Collection Time: 01/10/24  9:16 AM  Result Value Ref Range   Cholesterol, Total 120 100 - 199 mg/dL   Triglycerides 889 0 - 149 mg/dL   HDL 45 >60 mg/dL   VLDL Cholesterol Cal 20 5 - 40 mg/dL   LDL Chol Calc (NIH) 55 0 - 99 mg/dL   Chol/HDL Ratio 2.7 0.0 - 4.4 ratio    Comment:                                   T. Chol/HDL Ratio                                             Men  Women                               1/2 Avg.Risk  3.4    3.3                                   Avg.Risk  5.0    4.4                                2X Avg.Risk  9.6    7.1                                3X Avg.Risk 23.4   11.0        Assessment & Plan Right arm pain Getting x-rays of right shoulder and  elbow.  Will also send referral to physical therapy for patient.   Reassess at follow up; pt will let me know if not improving with therapy. Type 2 diabetes mellitus with hyperglycemia, with long-term current use of insulin  (HCC) Continue current diabetes POC, as patient has been well controlled on current regimen.  Will adjust meds if needed based on labs.   Essential hypertension, benign Blood pressure well controlled with current medications.  Continue current therapy.  Will reassess at follow up.   Mixed hyperlipidemia Continue current therapy for lipid control. Will modify as needed based on labwork results.     Return in about 3 months (around 04/13/2024).   Total time spent: 20 minutes  ALAN CHRISTELLA ARRANT, FNP  01/12/2024   This document may have been prepared by [facility] Voice Recognition software and as such may include unintentional dictation errors.

## 2024-01-13 ENCOUNTER — Encounter: Payer: Self-pay | Admitting: Cardiovascular Disease

## 2024-01-13 ENCOUNTER — Ambulatory Visit: Admitting: Cardiovascular Disease

## 2024-01-13 VITALS — BP 108/75 | HR 63 | Ht 63.0 in | Wt 130.0 lb

## 2024-01-13 DIAGNOSIS — I10 Essential (primary) hypertension: Secondary | ICD-10-CM | POA: Diagnosis not present

## 2024-01-13 DIAGNOSIS — E782 Mixed hyperlipidemia: Secondary | ICD-10-CM

## 2024-01-13 DIAGNOSIS — E119 Type 2 diabetes mellitus without complications: Secondary | ICD-10-CM

## 2024-01-13 DIAGNOSIS — I25810 Atherosclerosis of coronary artery bypass graft(s) without angina pectoris: Secondary | ICD-10-CM

## 2024-01-13 MED ORDER — CLOPIDOGREL BISULFATE 75 MG PO TABS: 75.0000 mg | ORAL_TABLET | Freq: Once | ORAL | 0 refills | Status: AC

## 2024-01-13 NOTE — Progress Notes (Signed)
 Cardiology Office Note   Date:  01/13/2024   ID:  Leah Hinton, DOB [birth year], MRN [id number]  PCP:  Orlean Alan HERO, FNP  Cardiologist:  Denyse Bathe, MD      History of Present Illness: Leah Hinton is a 58 y.o. female who presents for  Chief Complaint  Patient presents with   Follow-up    3 month follow up    No complaints      Past Medical History:  Diagnosis Date   Anxiety    Coronary artery disease    Depression    Diabetes mellitus without complication (HCC)    Hypertension    Myocardial infarction (HCC)    PONV (postoperative nausea and vomiting)      Past Surgical History:  Procedure Laterality Date   ABDOMINAL HYSTERECTOMY  2012   CESAREAN SECTION     x 2   CORONARY ARTERY BYPASS GRAFT     GANGLION CYST EXCISION Left    TRIGGER FINGER RELEASE Left 07/03/2021   Procedure: RELEASE TRIGGER FINGER/A-1 PULLEY -  INCISION, TENDON SHEATH (SURG);  Surgeon: Cleotilde Barrio, MD;  Location: [facility] ORS;  Service: Orthopedics;  Laterality: Left;     Current Outpatient Medications  Medication Sig Dispense Refill   aspirin EC 81 MG tablet Take 81 mg by mouth daily. Swallow whole.     lisinopril  (ZESTRIL ) 5 MG tablet Take 1 tablet (5 mg total) by mouth daily. 90 tablet 3   acarbose  (PRECOSE ) 100 MG tablet Take 1 tablet (100 mg total) by mouth 3 (three) times daily. 270 tablet 3   acetaminophen  (TYLENOL ) 500 MG tablet Take 1,000 mg by mouth every 6 (six) hours as needed for moderate pain.     albuterol (VENTOLIN HFA) 108 (90 Base) MCG/ACT inhaler Inhale 1 puff into the lungs every 6 (six) hours as needed for shortness of breath.     baclofen  (LIORESAL ) 10 MG tablet Take 1 tablet (10 mg total) by mouth daily. (Patient not taking: Reported on 01/13/2024) 30 tablet 1   citalopram  (CELEXA ) 40 MG tablet Take 1 tablet (40 mg total) by mouth at bedtime. 90 tablet 3   clopidogrel  (PLAVIX ) 75 MG tablet Take 1 tablet (75 mg total) by mouth once for 1 dose. 1 tablet 0    Dulaglutide  (TRULICITY ) 4.5 MG/0.5ML SOAJ Inject 4.5 mg as directed once a week. 2 mL 3   empagliflozin  (JARDIANCE ) 25 MG TABS tablet Take 1 tablet (25 mg total) by mouth daily. 90 tablet 3   ezetimibe  (ZETIA ) 10 MG tablet TAKE 1 TABLET(10 MG) BY MOUTH DAILY 90 tablet 1   glipiZIDE  (GLUCOTROL  XL) 10 MG 24 hr tablet Take 1 tablet (10 mg total) by mouth daily. 90 tablet 3   metoprolol  succinate (TOPROL -XL) 25 MG 24 hr tablet TAKE 1 TABLET BY MOUTH EVERY DAY 90 tablet 0   rosuvastatin  (CRESTOR ) 40 MG tablet TAKE 1 TABLET(40 MG) BY MOUTH DAILY 90 tablet 3   traZODone  (DESYREL ) 50 MG tablet TAKE 1 TABLET BY MOUTH EVERY NIGHT AT BEDTIME AS NEEDED FOR SLEEP 30 tablet 2   No current facility-administered medications for this visit.    Allergies:   Semaglutide and Metoprolol  tartrate    Social History:   reports that she has never smoked. She has never been exposed to tobacco smoke. She has never used smokeless tobacco. She reports that she does not drink alcohol and does not use drugs.   Family History:  family history includes Breast cancer (age  of onset: 49) in her maternal grandmother.    ROS:     Review of Systems  Constitutional: Negative.   HENT: Negative.    Eyes: Negative.   Respiratory: Negative.    Gastrointestinal: Negative.   Genitourinary: Negative.   Musculoskeletal: Negative.   Skin: Negative.   Neurological: Negative.   Endo/Heme/Allergies: Negative.   Psychiatric/Behavioral: Negative.    All other systems reviewed and are negative.     All other systems are reviewed and negative.    PHYSICAL EXAM: VS:  BP 108/75   Pulse 63   Ht 5' 3 (1.6 m)   Wt 130 lb (59 kg)   SpO2 97%   BMI 23.03 kg/m  , BMI Body mass index is 23.03 kg/m. Last weight:  Wt Readings from Last 3 Encounters:  01/13/24 130 lb (59 kg)  01/12/24 130 lb (59 kg)  10/25/23 124 lb (56.2 kg)     Physical Exam Constitutional:      Appearance: Normal appearance.   Cardiovascular:      Rate and Rhythm: Normal rate and regular rhythm.     Heart sounds: Normal heart sounds.  Pulmonary:     Effort: Pulmonary effort is normal.     Breath sounds: Normal breath sounds.   Musculoskeletal:     Right lower leg: No edema.     Left lower leg: No edema.   Neurological:     Mental Status: She is alert.       EKG:   Recent Labs: 01/10/2024: ALT 48; BUN 11; Creatinine, Ser 0.74; Hemoglobin 13.9; Platelets 207; Potassium 4.3; Sodium 141; TSH 2.530    Lipid Panel    Component Value Date/Time   CHOL 120 01/10/2024 0916   TRIG 110 01/10/2024 0916   HDL 45 01/10/2024 0916   CHOLHDL 2.7 01/10/2024 0916   LDLCALC 55 01/10/2024 0916      Other studies Reviewed: Additional studies/ records that were reviewed today include:  Review of the above records demonstrates:       No data to display            ASSESSMENT AND PLAN:    ICD-10-CM   1. Essential hypertension, benign  I10     2. Mixed hyperlipidemia  E78.2     3. Coronary artery disease involving coronary bypass graft of native heart without angina pectoris  I25.810    No chest pains.    4. Diabetes mellitus without complication (HCC)  E11.9        Problem List Items Addressed This Visit       Cardiovascular and Mediastinum   Coronary artery disease   Essential hypertension, benign - Primary     Other   Mixed hyperlipidemia   Other Visit Diagnoses       Diabetes mellitus without complication (HCC)              Disposition:   Return in about 4 months (around 05/14/2024).    Total time spent: 30 minutes  Signed,  Denyse Bathe, MD  01/13/2024 2:46 PM    [facility]

## 2024-01-16 ENCOUNTER — Encounter: Payer: Self-pay | Admitting: Family

## 2024-01-16 NOTE — Assessment & Plan Note (Signed)
 Blood pressure well controlled with current medications.  Continue current therapy.  Will reassess at follow up.

## 2024-01-16 NOTE — Assessment & Plan Note (Signed)
 Continue current therapy for lipid control. Will modify as needed based on labwork results.

## 2024-01-16 NOTE — Assessment & Plan Note (Signed)
 Continue current diabetes POC, as patient has been well controlled on current regimen.  Will adjust meds if needed based on labs.

## 2024-01-19 ENCOUNTER — Ambulatory Visit: Payer: Self-pay | Admitting: Family

## 2024-01-19 NOTE — Progress Notes (Signed)
 Patient informed.

## 2024-01-31 ENCOUNTER — Telehealth: Payer: Self-pay | Admitting: Family

## 2024-01-31 MED ORDER — AMOXICILLIN-POT CLAVULANATE 875-125 MG PO TABS
1.0000 | ORAL_TABLET | Freq: Two times a day (BID) | ORAL | 0 refills | Status: DC
Start: 2024-01-31 — End: 2024-05-11

## 2024-01-31 NOTE — Telephone Encounter (Signed)
 Patient stopped by about an appeal for her Jardiance  and showed me where she was bit by her daughter's cat on Friday, 7/11. It is her right middle finger. The finger is swollen and red and a little warm to the touch. Can we send her in an antibiotic?  [facility] - [place]

## 2024-02-14 ENCOUNTER — Other Ambulatory Visit: Payer: Self-pay | Admitting: Cardiovascular Disease

## 2024-02-14 DIAGNOSIS — I340 Nonrheumatic mitral (valve) insufficiency: Secondary | ICD-10-CM

## 2024-04-12 ENCOUNTER — Other Ambulatory Visit: Payer: Self-pay

## 2024-04-12 ENCOUNTER — Other Ambulatory Visit

## 2024-04-12 DIAGNOSIS — E559 Vitamin D deficiency, unspecified: Secondary | ICD-10-CM

## 2024-04-12 DIAGNOSIS — E538 Deficiency of other specified B group vitamins: Secondary | ICD-10-CM

## 2024-04-12 DIAGNOSIS — I10 Essential (primary) hypertension: Secondary | ICD-10-CM

## 2024-04-12 DIAGNOSIS — E782 Mixed hyperlipidemia: Secondary | ICD-10-CM

## 2024-04-12 DIAGNOSIS — E039 Hypothyroidism, unspecified: Secondary | ICD-10-CM

## 2024-04-12 DIAGNOSIS — R5383 Other fatigue: Secondary | ICD-10-CM

## 2024-04-12 DIAGNOSIS — E1165 Type 2 diabetes mellitus with hyperglycemia: Secondary | ICD-10-CM

## 2024-04-13 ENCOUNTER — Ambulatory Visit: Payer: Self-pay

## 2024-04-13 ENCOUNTER — Encounter: Payer: Self-pay | Admitting: Family

## 2024-04-13 ENCOUNTER — Ambulatory Visit: Admitting: Family

## 2024-04-13 VITALS — BP 104/62 | HR 65 | Ht 63.0 in | Wt 126.6 lb

## 2024-04-13 DIAGNOSIS — E1165 Type 2 diabetes mellitus with hyperglycemia: Secondary | ICD-10-CM

## 2024-04-13 DIAGNOSIS — E782 Mixed hyperlipidemia: Secondary | ICD-10-CM | POA: Diagnosis not present

## 2024-04-13 DIAGNOSIS — I10 Essential (primary) hypertension: Secondary | ICD-10-CM

## 2024-04-13 DIAGNOSIS — Z794 Long term (current) use of insulin: Secondary | ICD-10-CM | POA: Diagnosis not present

## 2024-04-13 DIAGNOSIS — E538 Deficiency of other specified B group vitamins: Secondary | ICD-10-CM

## 2024-04-13 DIAGNOSIS — E559 Vitamin D deficiency, unspecified: Secondary | ICD-10-CM

## 2024-04-13 DIAGNOSIS — I25810 Atherosclerosis of coronary artery bypass graft(s) without angina pectoris: Secondary | ICD-10-CM | POA: Diagnosis not present

## 2024-04-13 LAB — HEMOGLOBIN A1C
Est. average glucose Bld gHb Est-mCnc: 148 mg/dL
Hgb A1c MFr Bld: 6.8 % — ABNORMAL HIGH (ref 4.8–5.6)

## 2024-04-13 LAB — CMP14+EGFR
ALT: 77 IU/L — ABNORMAL HIGH (ref 0–32)
AST: 31 IU/L (ref 0–40)
Albumin: 4.5 g/dL (ref 3.8–4.9)
Alkaline Phosphatase: 131 IU/L (ref 49–135)
BUN/Creatinine Ratio: 24 — ABNORMAL HIGH (ref 9–23)
BUN: 21 mg/dL (ref 6–24)
Bilirubin Total: 0.3 mg/dL (ref 0.0–1.2)
CO2: 21 mmol/L (ref 20–29)
Calcium: 9.8 mg/dL (ref 8.7–10.2)
Chloride: 102 mmol/L (ref 96–106)
Creatinine, Ser: 0.88 mg/dL (ref 0.57–1.00)
Globulin, Total: 2.6 g/dL (ref 1.5–4.5)
Glucose: 207 mg/dL — ABNORMAL HIGH (ref 70–99)
Potassium: 4.7 mmol/L (ref 3.5–5.2)
Sodium: 139 mmol/L (ref 134–144)
Total Protein: 7.1 g/dL (ref 6.0–8.5)
eGFR: 76 mL/min/1.73 (ref >59)

## 2024-04-13 LAB — LIPID PANEL
Chol/HDL Ratio: 5.9 ratio — ABNORMAL HIGH (ref 0.0–4.4)
Cholesterol, Total: 243 mg/dL — ABNORMAL HIGH (ref 100–199)
HDL: 41 mg/dL (ref >39)
LDL Chol Calc (NIH): 134 mg/dL — ABNORMAL HIGH (ref 0–99)
Triglycerides: 375 mg/dL — ABNORMAL HIGH (ref 0–149)
VLDL Cholesterol Cal: 68 mg/dL — ABNORMAL HIGH (ref 5–40)

## 2024-04-13 LAB — CBC WITH DIFFERENTIAL/PLATELET
Basophils Absolute: 0.1 x10E3/uL (ref 0.0–0.2)
Basos: 1 %
EOS (ABSOLUTE): 0.2 x10E3/uL (ref 0.0–0.4)
Eos: 3 %
Hematocrit: 50 % — ABNORMAL HIGH (ref 34.0–46.6)
Hemoglobin: 16.4 g/dL — ABNORMAL HIGH (ref 11.1–15.9)
Immature Grans (Abs): 0.1 x10E3/uL (ref 0.0–0.1)
Immature Granulocytes: 1 %
Lymphocytes Absolute: 2.6 x10E3/uL (ref 0.7–3.1)
Lymphs: 34 %
MCH: 29.4 pg (ref 26.6–33.0)
MCHC: 32.8 g/dL (ref 31.5–35.7)
MCV: 90 fL (ref 79–97)
Monocytes Absolute: 0.7 x10E3/uL (ref 0.1–0.9)
Monocytes: 10 %
Neutrophils Absolute: 4.1 x10E3/uL (ref 1.4–7.0)
Neutrophils: 51 %
Platelets: 263 x10E3/uL (ref 150–450)
RBC: 5.57 x10E6/uL — ABNORMAL HIGH (ref 3.77–5.28)
RDW: 12.8 % (ref 11.7–15.4)
WBC: 7.8 x10E3/uL (ref 3.4–10.8)

## 2024-04-13 LAB — POC CREATINE & ALBUMIN,URINE
Creatinine, POC: 50 mg/dL
Microalbumin Ur, POC: 30 mg/L

## 2024-04-13 LAB — VITAMIN B12: Vitamin B-12: 508 pg/mL (ref 232–1245)

## 2024-04-13 LAB — VITAMIN D 25 HYDROXY (VIT D DEFICIENCY, FRACTURES): Vit D, 25-Hydroxy: 17.6 ng/mL — ABNORMAL LOW (ref 30.0–100.0)

## 2024-04-13 LAB — TSH: TSH: 0.609 u[IU]/mL (ref 0.450–4.500)

## 2024-04-13 NOTE — Assessment & Plan Note (Signed)
Patient is seen by cardiology, who manage this condition.  She is well controlled with current therapy.   Will defer to them for further changes to plan of care.

## 2024-04-13 NOTE — Assessment & Plan Note (Signed)
 Blood pressure well controlled with current medications.  Continue current therapy.  Will reassess at follow up.

## 2024-04-13 NOTE — Progress Notes (Signed)
 Established Patient Office Visit  Subjective:  Patient ID: Leah Hinton, female    DOB: Aug 04, 1965  Age: 58 y.o. MRN: [id number]  Chief Complaint  Patient presents with   Follow-up    3 month follow up    Patient is here today for her 3 months follow up.  She has been feeling fairly well since last appointment.   She does have additional concerns to discuss today.  We have been unable to get her Jardiance  approved through her insurance.   Labs were done prior to appointment, will discuss in detail today.  A1C is much improved, down to 6.8 from 11.8.   She needs refills.   I have reviewed her active problem list, medication list, allergies, health maintenance, notes from last encounter, lab results for her appointment today.     No other concerns at this time.   Past Medical History:  Diagnosis Date   Anxiety    Coronary artery disease    Depression    Diabetes mellitus without complication (HCC)    Hypertension    Myocardial infarction (HCC)    PONV (postoperative nausea and vomiting)     Past Surgical History:  Procedure Laterality Date   ABDOMINAL HYSTERECTOMY  2012   CESAREAN SECTION     x 2   CORONARY ARTERY BYPASS GRAFT     GANGLION CYST EXCISION Left    TRIGGER FINGER RELEASE Left 07/03/2021   Procedure: RELEASE TRIGGER FINGER/A-1 PULLEY -  INCISION, TENDON SHEATH (SURG);  Surgeon: Cleotilde Barrio, MD;  Location: [facility] ORS;  Service: Orthopedics;  Laterality: Left;    Social History   Socioeconomic History   Marital status: Widowed    Spouse name: Not on file   Number of children: Not on file   Years of education: Not on file   Highest education level: Not on file  Occupational History   Not on file  Tobacco Use   Smoking status: Never    Passive exposure: Never   Smokeless tobacco: Never  Vaping Use   Vaping status: Never Used  Substance and Sexual Activity   Alcohol use: Never   Drug use: Never   Sexual activity: Not on file  Other  Topics Concern   Not on file  Social History Narrative   Lives with mother and step dad.   Social Drivers of Corporate investment banker Strain: Not on file  Food Insecurity: Not on file  Transportation Needs: Not on file  Physical Activity: Not on file  Stress: Not on file  Social Connections: Not on file  Intimate Partner Violence: Not on file    Family History  Problem Relation Age of Onset   Breast cancer Maternal Grandmother 56    Allergies  Allergen Reactions   Semaglutide Nausea And Vomiting   Metoprolol  Tartrate     Headaches, tolerates metoprolol  succinate     Review of Systems  All other systems reviewed and are negative.      Objective:   BP 104/62   Pulse 65   Ht 5' 3 (1.6 m)   Wt 126 lb 9.6 oz (57.4 kg)   SpO2 96%   BMI 22.43 kg/m   Vitals:   04/13/24 1120  BP: 104/62  Pulse: 65  Height: 5' 3 (1.6 m)  Weight: 126 lb 9.6 oz (57.4 kg)  SpO2: 96%  BMI (Calculated): 22.43    Physical Exam Vitals and nursing note reviewed.  Constitutional:      Appearance: Normal  appearance. She is normal weight.  HENT:     Head: Normocephalic.  Eyes:     Extraocular Movements: Extraocular movements intact.     Conjunctiva/sclera: Conjunctivae normal.     Pupils: Pupils are equal, round, and reactive to light.  Cardiovascular:     Rate and Rhythm: Normal rate.  Pulmonary:     Effort: Pulmonary effort is normal.  Neurological:     General: No focal deficit present.     Mental Status: She is alert and oriented to person, place, and time. Mental status is at baseline.  Psychiatric:        Mood and Affect: Mood normal.        Behavior: Behavior normal.        Thought Content: Thought content normal.      Results for orders placed or performed in visit on 04/13/24  POC CREATINE & ALBUMIN,URINE  Result Value Ref Range   Microalbumin Ur, POC 30 mg/L   Creatinine, POC 50 mg/dL   Albumin/Creatinine Ratio, Urine, POC 30-300     Recent Results  (from the past 2160 hours)  CBC with Differential/Platelet     Status: Abnormal   Collection Time: 04/12/24 10:12 AM  Result Value Ref Range   WBC 7.8 3.4 - 10.8 x10E3/uL   RBC 5.57 (H) 3.77 - 5.28 x10E6/uL   Hemoglobin 16.4 (H) 11.1 - 15.9 g/dL   Hematocrit 49.9 (H) 65.9 - 46.6 %   MCV 90 79 - 97 fL   MCH 29.4 26.6 - 33.0 pg   MCHC 32.8 31.5 - 35.7 g/dL   RDW 87.1 88.2 - 84.5 %   Platelets 263 150 - 450 x10E3/uL   Neutrophils 51 Not Estab. %   Lymphs 34 Not Estab. %   Monocytes 10 Not Estab. %   Eos 3 Not Estab. %   Basos 1 Not Estab. %   Neutrophils Absolute 4.1 1.4 - 7.0 x10E3/uL   Lymphocytes Absolute 2.6 0.7 - 3.1 x10E3/uL   Monocytes Absolute 0.7 0.1 - 0.9 x10E3/uL   EOS (ABSOLUTE) 0.2 0.0 - 0.4 x10E3/uL   Basophils Absolute 0.1 0.0 - 0.2 x10E3/uL   Immature Granulocytes 1 Not Estab. %   Immature Grans (Abs) 0.1 0.0 - 0.1 x10E3/uL  CMP14+EGFR     Status: Abnormal   Collection Time: 04/12/24 10:12 AM  Result Value Ref Range   Glucose 207 (H) 70 - 99 mg/dL   BUN 21 6 - 24 mg/dL   Creatinine, Ser 9.11 0.57 - 1.00 mg/dL   eGFR 76 >40 fO/fpw/8.26   BUN/Creatinine Ratio 24 (H) 9 - 23   Sodium 139 134 - 144 mmol/L   Potassium 4.7 3.5 - 5.2 mmol/L   Chloride 102 96 - 106 mmol/L   CO2 21 20 - 29 mmol/L   Calcium  9.8 8.7 - 10.2 mg/dL   Total Protein 7.1 6.0 - 8.5 g/dL   Albumin 4.5 3.8 - 4.9 g/dL   Globulin, Total 2.6 1.5 - 4.5 g/dL   Bilirubin Total 0.3 0.0 - 1.2 mg/dL   Alkaline Phosphatase 131 49 - 135 IU/L    Comment:               **Please note reference interval change**   AST 31 0 - 40 IU/L   ALT 77 (H) 0 - 32 IU/L  Hemoglobin A1c     Status: Abnormal   Collection Time: 04/12/24 10:12 AM  Result Value Ref Range   Hgb A1c MFr Bld 6.8 (H)  4.8 - 5.6 %    Comment:          Prediabetes: 5.7 - 6.4          Diabetes: >6.4          Glycemic control for adults with diabetes: <7.0    Est. average glucose Bld gHb Est-mCnc 148 mg/dL  Lipid panel     Status: Abnormal    Collection Time: 04/12/24 10:12 AM  Result Value Ref Range   Cholesterol, Total 243 (H) 100 - 199 mg/dL   Triglycerides 624 (H) 0 - 149 mg/dL   HDL 41 >60 mg/dL   VLDL Cholesterol Cal 68 (H) 5 - 40 mg/dL   LDL Chol Calc (NIH) 865 (H) 0 - 99 mg/dL   Chol/HDL Ratio 5.9 (H) 0.0 - 4.4 ratio    Comment:                                   T. Chol/HDL Ratio                                             Men  Women                               1/2 Avg.Risk  3.4    3.3                                   Avg.Risk  5.0    4.4                                2X Avg.Risk  9.6    7.1                                3X Avg.Risk 23.4   11.0   TSH     Status: None   Collection Time: 04/12/24 10:12 AM  Result Value Ref Range   TSH 0.609 0.450 - 4.500 uIU/mL  Vitamin D  (25 hydroxy)     Status: Abnormal   Collection Time: 04/12/24 10:12 AM  Result Value Ref Range   Vit D, 25-Hydroxy 17.6 (L) 30.0 - 100.0 ng/mL    Comment: Vitamin D  deficiency has been defined by the Institute of Medicine and an Endocrine Society practice guideline as a level of serum 25-OH vitamin D  less than 20 ng/mL (1,2). The Endocrine Society went on to further define vitamin D  insufficiency as a level between 21 and 29 ng/mL (2). 1. IOM (Institute of Medicine). 2010. Dietary reference    intakes for calcium  and D. [place]: The    [employer]. 2. Holick MF, Binkley [place], Bischoff-Ferrari HA, et al.    Evaluation, treatment, and prevention of vitamin D     deficiency: an Endocrine Society clinical practice    guideline. JCEM. 2011 Jul; 96(7):1911-30.   Vitamin B12     Status: None   Collection Time: 04/12/24 10:12 AM  Result Value Ref Range   Vitamin B-12 508 232 - 1,245 pg/mL  POC CREATINE & ALBUMIN,URINE  Status: None   Collection Time: 04/13/24 11:47 AM  Result Value Ref Range   Microalbumin Ur, POC 30 mg/L   Creatinine, POC 50 mg/dL   Albumin/Creatinine Ratio, Urine, POC 30-300        Assessment &  Plan Type 2 diabetes mellitus with hyperglycemia, with long-term current use of insulin (HCC) Patient is going to look into switching her medicaid plan so we can get her meds approved.   For now, Continue current diabetes POC, as patient has been well controlled on current regimen.  Will adjust meds if needed based on labs.   Essential hypertension, benign Blood pressure well controlled with current medications.  Continue current therapy.  Will reassess at follow up.   Mixed hyperlipidemia Adding Nexletol.  Continue current therapy for lipid control. Will modify as needed based on labwork results.   Coronary artery disease involving coronary bypass graft of native heart without angina pectoris Patient is seen by cardiology, who manage this condition.  She is well controlled with current therapy.   Will defer to them for further changes to plan of care.  Vitamin D  deficiency, unspecified B12 deficiency due to diet Will continue supplements as needed.      Return in about 3 months (around 07/13/2024).   Total time spent: 20 minutes  ALAN CHRISTELLA ARRANT, FNP  04/13/2024   This document may have been prepared by [facility] Voice Recognition software and as such may include unintentional dictation errors.

## 2024-04-13 NOTE — Assessment & Plan Note (Signed)
 Adding Nexletol.  Continue current therapy for lipid control. Will modify as needed based on labwork results.

## 2024-04-13 NOTE — Assessment & Plan Note (Signed)
 Patient is going to look into switching her medicaid plan so we can get her meds approved.   For now, Continue current diabetes POC, as patient has been well controlled on current regimen.  Will adjust meds if needed based on labs.

## 2024-04-15 ENCOUNTER — Other Ambulatory Visit: Payer: Self-pay | Admitting: Cardiovascular Disease

## 2024-04-20 ENCOUNTER — Telehealth: Payer: Self-pay | Admitting: Family

## 2024-04-20 ENCOUNTER — Other Ambulatory Visit: Payer: Self-pay | Admitting: Family

## 2024-04-20 ENCOUNTER — Other Ambulatory Visit: Payer: Self-pay

## 2024-04-20 MED ORDER — CLOPIDOGREL BISULFATE 75 MG PO TABS: 75.0000 mg | ORAL_TABLET | Freq: Every day | ORAL | 3 refills | Status: AC

## 2024-04-20 MED ORDER — ONDANSETRON HCL 4 MG PO TABS: 4.0000 mg | ORAL_TABLET | Freq: Three times a day (TID) | ORAL | 2 refills | Status: DC | PRN

## 2024-04-20 NOTE — Telephone Encounter (Signed)
 PT came in stating that she has been OUT of her PLAVIX  for 2 days & goes out of town [id number]

## 2024-04-20 NOTE — Telephone Encounter (Signed)
 Already sent.

## 2024-05-11 ENCOUNTER — Other Ambulatory Visit: Payer: Self-pay

## 2024-05-11 ENCOUNTER — Ambulatory Visit: Admitting: Cardiovascular Disease

## 2024-05-11 ENCOUNTER — Encounter: Payer: Self-pay | Admitting: Cardiovascular Disease

## 2024-05-11 VITALS — BP 118/62 | HR 59 | Ht 63.0 in | Wt 129.0 lb

## 2024-05-11 DIAGNOSIS — E1165 Type 2 diabetes mellitus with hyperglycemia: Secondary | ICD-10-CM

## 2024-05-11 DIAGNOSIS — E782 Mixed hyperlipidemia: Secondary | ICD-10-CM

## 2024-05-11 DIAGNOSIS — I25798 Atherosclerosis of other coronary artery bypass graft(s) with other forms of angina pectoris: Secondary | ICD-10-CM

## 2024-05-11 DIAGNOSIS — I25810 Atherosclerosis of coronary artery bypass graft(s) without angina pectoris: Secondary | ICD-10-CM

## 2024-05-11 DIAGNOSIS — I10 Essential (primary) hypertension: Secondary | ICD-10-CM

## 2024-05-11 DIAGNOSIS — I340 Nonrheumatic mitral (valve) insufficiency: Secondary | ICD-10-CM

## 2024-05-11 DIAGNOSIS — E119 Type 2 diabetes mellitus without complications: Secondary | ICD-10-CM

## 2024-05-11 MED ORDER — TRULICITY 4.5 MG/0.5ML ~~LOC~~ SOAJ: 4.5000 mg | SUBCUTANEOUS | 3 refills | Status: AC

## 2024-05-11 MED ORDER — EMPAGLIFLOZIN 25 MG PO TABS: 25.0000 mg | ORAL_TABLET | Freq: Every day | ORAL | 3 refills | Status: DC

## 2024-05-11 NOTE — Progress Notes (Signed)
 Cardiology Office Note   Date:  05/11/2024   ID:  Leah, Hinton May 14, 1966, MRN [id number]  PCP:  Orlean Alan HERO, FNP  Cardiologist:  Denyse Bathe, MD      History of Present Illness: Leah Hinton is a 58 y.o. female who presents for  Chief Complaint  Patient presents with   Follow-up    4  month follow up    Denies any chest pain or shortness of breath.  Cardiology medicines   Past Medical History:  Diagnosis Date   Anxiety    Coronary artery disease    Depression    Diabetes mellitus without complication (HCC)    Hypertension    Myocardial infarction (HCC)    PONV (postoperative nausea and vomiting)      Past Surgical History:  Procedure Laterality Date   ABDOMINAL HYSTERECTOMY  2012   CESAREAN SECTION     x 2   CORONARY ARTERY BYPASS GRAFT     GANGLION CYST EXCISION Left    TRIGGER FINGER RELEASE Left 07/03/2021   Procedure: RELEASE TRIGGER FINGER/A-1 PULLEY -  INCISION, TENDON SHEATH (SURG);  Surgeon: Cleotilde Barrio, MD;  Location: [facility] ORS;  Service: Orthopedics;  Laterality: Left;     Current Outpatient Medications  Medication Sig Dispense Refill   acarbose  (PRECOSE ) 100 MG tablet Take 1 tablet (100 mg total) by mouth 3 (three) times daily. 270 tablet 3   acetaminophen  (TYLENOL ) 500 MG tablet Take 1,000 mg by mouth every 6 (six) hours as needed for moderate pain.     albuterol (VENTOLIN HFA) 108 (90 Base) MCG/ACT inhaler Inhale 1 puff into the lungs every 6 (six) hours as needed for shortness of breath.     aspirin EC 81 MG tablet Take 81 mg by mouth daily. Swallow whole.     baclofen  (LIORESAL ) 10 MG tablet Take 1 tablet (10 mg total) by mouth daily. (Patient taking differently: Take 10 mg by mouth as needed.) 30 tablet 1   citalopram  (CELEXA ) 40 MG tablet Take 1 tablet (40 mg total) by mouth at bedtime. 90 tablet 3   clopidogrel  (PLAVIX ) 75 MG tablet Take 1 tablet (75 mg total) by mouth daily. 90 tablet 3   Dulaglutide  (TRULICITY ) 4.5  MG/0.5ML SOAJ Inject 4.5 mg as directed once a week. 2 mL 3   empagliflozin  (JARDIANCE ) 25 MG TABS tablet Take 1 tablet (25 mg total) by mouth daily. 90 tablet 3   ezetimibe  (ZETIA ) 10 MG tablet TAKE 1 TABLET(10 MG) BY MOUTH DAILY 90 tablet 1   glipiZIDE  (GLUCOTROL  XL) 10 MG 24 hr tablet Take 1 tablet (10 mg total) by mouth daily. 90 tablet 3   lisinopril  (ZESTRIL ) 5 MG tablet Take 1 tablet (5 mg total) by mouth daily. 90 tablet 3   metoprolol  succinate (TOPROL -XL) 25 MG 24 hr tablet TAKE 1 TABLET BY MOUTH EVERY DAY 90 tablet 0   ondansetron  (ZOFRAN ) 4 MG tablet Take 1 tablet (4 mg total) by mouth every 8 (eight) hours as needed for nausea or vomiting. 20 tablet 2   rosuvastatin  (CRESTOR ) 40 MG tablet TAKE 1 TABLET(40 MG) BY MOUTH DAILY 90 tablet 3   traZODone  (DESYREL ) 50 MG tablet TAKE 1 TABLET BY MOUTH EVERY NIGHT AT BEDTIME AS NEEDED FOR SLEEP 30 tablet 2   No current facility-administered medications for this visit.    Allergies:   Semaglutide and Metoprolol  tartrate    Social History:   reports that she has never smoked. She has  never been exposed to tobacco smoke. She has never used smokeless tobacco. She reports that she does not drink alcohol and does not use drugs.   Family History:  family history includes Breast cancer (age of onset: 5) in her maternal grandmother.    ROS:     Review of Systems  Constitutional: Negative.   HENT: Negative.    Eyes: Negative.   Respiratory: Negative.    Gastrointestinal: Negative.   Genitourinary: Negative.   Musculoskeletal: Negative.   Skin: Negative.   Neurological: Negative.   Endo/Heme/Allergies: Negative.   Psychiatric/Behavioral: Negative.    All other systems reviewed and are negative.     All other systems are reviewed and negative.    PHYSICAL EXAM: VS:  BP 118/62   Pulse (!) 59   Ht 5' 3 (1.6 m)   Wt 129 lb (58.5 kg)   SpO2 98%   BMI 22.85 kg/m  , BMI Body mass index is 22.85 kg/m. Last weight:  Wt Readings  from Last 3 Encounters:  05/11/24 129 lb (58.5 kg)  04/13/24 126 lb 9.6 oz (57.4 kg)  01/13/24 130 lb (59 kg)     Physical Exam    EKG:   Recent Labs: 04/12/2024: ALT 77; BUN 21; Creatinine, Ser 0.88; Hemoglobin 16.4; Platelets 263; Potassium 4.7; Sodium 139; TSH 0.609    Lipid Panel    Component Value Date/Time   CHOL 243 (H) 04/12/2024 1012   TRIG 375 (H) 04/12/2024 1012   HDL 41 04/12/2024 1012   CHOLHDL 5.9 (H) 04/12/2024 1012   LDLCALC 134 (H) 04/12/2024 1012      Other studies Reviewed: Additional studies/ records that were reviewed today include:  Review of the above records demonstrates:       No data to display            ASSESSMENT AND PLAN:    ICD-10-CM   1. Nonrheumatic mitral valve regurgitation  I34.0    Echo had trace mitral regurgitation and mild aortic regurgitation normal EF    2. Coronary artery disease involving other coronary artery bypass graft with other forms of angina pectoris  I25.798 empagliflozin  (JARDIANCE ) 25 MG TABS tablet   Stress test in March 24 was unremarkable and denies any chest pain or shortness of breath.    3. Mixed hyperlipidemia  E78.2 empagliflozin  (JARDIANCE ) 25 MG TABS tablet    4. Essential hypertension, benign  I10 empagliflozin  (JARDIANCE ) 25 MG TABS tablet   Stable    5. Diabetes mellitus without complication (HCC)  E11.9 empagliflozin  (JARDIANCE ) 25 MG TABS tablet    6. Coronary artery disease involving other coronary artery bypass graft with other forms of angina pectoris  I25.798 empagliflozin  (JARDIANCE ) 25 MG TABS tablet   no chest pain    7. Type 2 diabetes mellitus with hyperglycemia, without long-term current use of insulin (HCC)  E11.65 empagliflozin  (JARDIANCE ) 25 MG TABS tablet    8. Essential hypertension, benign  I10 empagliflozin  (JARDIANCE ) 25 MG TABS tablet    9. Mixed hyperlipidemia  E78.2 empagliflozin  (JARDIANCE ) 25 MG TABS tablet   11/24, LDL 64, needs to be 55 as had CABG, and will  change lipitor to crestor  40    10. Coronary artery disease involving coronary bypass graft of native heart without angina pectoris  I25.810 empagliflozin  (JARDIANCE ) 25 MG TABS tablet   has been stable, no chest pain       Problem List Items Addressed This Visit       Cardiovascular and  Mediastinum   Coronary artery disease   Relevant Medications   empagliflozin  (JARDIANCE ) 25 MG TABS tablet   Essential hypertension, benign   Relevant Medications   empagliflozin  (JARDIANCE ) 25 MG TABS tablet     Endocrine   Type 2 diabetes mellitus with hyperglycemia (HCC)   Relevant Medications   empagliflozin  (JARDIANCE ) 25 MG TABS tablet     Other   Mixed hyperlipidemia   Relevant Medications   empagliflozin  (JARDIANCE ) 25 MG TABS tablet   Other Visit Diagnoses       Nonrheumatic mitral valve regurgitation    -  Primary   Echo had trace mitral regurgitation and mild aortic regurgitation normal EF     Diabetes mellitus without complication (HCC)       Relevant Medications   empagliflozin  (JARDIANCE ) 25 MG TABS tablet          Disposition:   Return in about 3 months (around 08/11/2024).    Total time spent: 30 minutes  Signed,  Denyse Bathe, MD  05/11/2024 2:58 PM    [facility]

## 2024-05-15 ENCOUNTER — Other Ambulatory Visit: Payer: Self-pay | Admitting: Family

## 2024-05-15 ENCOUNTER — Other Ambulatory Visit: Payer: Self-pay | Admitting: Cardiology

## 2024-05-15 ENCOUNTER — Other Ambulatory Visit: Payer: Self-pay | Admitting: Cardiovascular Disease

## 2024-05-15 DIAGNOSIS — E782 Mixed hyperlipidemia: Secondary | ICD-10-CM

## 2024-05-15 DIAGNOSIS — I340 Nonrheumatic mitral (valve) insufficiency: Secondary | ICD-10-CM

## 2024-06-05 ENCOUNTER — Ambulatory Visit
Admission: RE | Admit: 2024-06-05 | Discharge: 2024-06-05 | Disposition: A | Discharge status: Home / Self Care | Attending: Family | Admitting: Family

## 2024-06-05 ENCOUNTER — Ambulatory Visit: Admitting: Family

## 2024-06-05 ENCOUNTER — Encounter: Payer: Self-pay | Admitting: Family

## 2024-06-05 ENCOUNTER — Ambulatory Visit
Admission: RE | Admit: 2024-06-05 | Discharge: 2024-06-05 | Disposition: A | Source: Ambulatory Visit | Attending: Family | Admitting: Family

## 2024-06-05 VITALS — BP 106/68 | HR 69 | Ht 63.0 in | Wt 128.0 lb

## 2024-06-05 DIAGNOSIS — Z0130 Encounter for examination of blood pressure without abnormal findings: Secondary | ICD-10-CM

## 2024-06-05 DIAGNOSIS — E1165 Type 2 diabetes mellitus with hyperglycemia: Secondary | ICD-10-CM

## 2024-06-05 DIAGNOSIS — M25471 Effusion, right ankle: Secondary | ICD-10-CM

## 2024-06-05 DIAGNOSIS — M25571 Pain in right ankle and joints of right foot: Secondary | ICD-10-CM | POA: Insufficient documentation

## 2024-06-05 NOTE — Progress Notes (Signed)
 Acute Office Visit  Subjective:     Patient ID: Leah Hinton, female    DOB: 05-14-1966, 58 y.o.   MRN: [id number]  Patient is in today for  Chief Complaint  Patient presents with   Acute Visit    Knot on right foot, edema    Ankle Pain  The incident occurred more than 1 week ago. There was no injury mechanism. The pain is present in the right ankle. The quality of the pain is described as aching. The pain is moderate. The pain has been Improving since onset. Associated symptoms comments: Lump present on anterior ankle area, approximately 1 cm in diameter. She reports no foreign bodies present. The symptoms are aggravated by movement, palpation and weight bearing. She has tried ice, NSAIDs and non-weight bearing for the symptoms. The treatment provided mild relief.     Review of Systems  Musculoskeletal:  Positive for joint pain.       Lump on ankle  All other systems reviewed and are negative.       Objective:    BP 106/68   Pulse 69   Ht 5' 3 (1.6 m)   Wt 128 lb (58.1 kg)   SpO2 96%   BMI 22.67 kg/m   Physical Exam Musculoskeletal:     Right ankle: Tenderness present over the ATF ligament.       Feet:     No results found for any visits on 06/05/24.  Recent Results (from the past 2160 hours)  CBC with Differential/Platelet     Status: Abnormal   Collection Time: 04/12/24 10:12 AM  Result Value Ref Range   WBC 7.8 3.4 - 10.8 x10E3/uL   RBC 5.57 (H) 3.77 - 5.28 x10E6/uL   Hemoglobin 16.4 (H) 11.1 - 15.9 g/dL   Hematocrit 49.9 (H) 65.9 - 46.6 %   MCV 90 79 - 97 fL   MCH 29.4 26.6 - 33.0 pg   MCHC 32.8 31.5 - 35.7 g/dL   RDW 87.1 88.2 - 84.5 %   Platelets 263 150 - 450 x10E3/uL   Neutrophils 51 Not Estab. %   Lymphs 34 Not Estab. %   Monocytes 10 Not Estab. %   Eos 3 Not Estab. %   Basos 1 Not Estab. %   Neutrophils Absolute 4.1 1.4 - 7.0 x10E3/uL   Lymphocytes Absolute 2.6 0.7 - 3.1 x10E3/uL   Monocytes Absolute 0.7 0.1 - 0.9 x10E3/uL   EOS  (ABSOLUTE) 0.2 0.0 - 0.4 x10E3/uL   Basophils Absolute 0.1 0.0 - 0.2 x10E3/uL   Immature Granulocytes 1 Not Estab. %   Immature Grans (Abs) 0.1 0.0 - 0.1 x10E3/uL  CMP14+EGFR     Status: Abnormal   Collection Time: 04/12/24 10:12 AM  Result Value Ref Range   Glucose 207 (H) 70 - 99 mg/dL   BUN 21 6 - 24 mg/dL   Creatinine, Ser 9.11 0.57 - 1.00 mg/dL   eGFR 76 >40 fO/fpw/8.26   BUN/Creatinine Ratio 24 (H) 9 - 23   Sodium 139 134 - 144 mmol/L   Potassium 4.7 3.5 - 5.2 mmol/L   Chloride 102 96 - 106 mmol/L   CO2 21 20 - 29 mmol/L   Calcium  9.8 8.7 - 10.2 mg/dL   Total Protein 7.1 6.0 - 8.5 g/dL   Albumin 4.5 3.8 - 4.9 g/dL   Globulin, Total 2.6 1.5 - 4.5 g/dL   Bilirubin Total 0.3 0.0 - 1.2 mg/dL   Alkaline Phosphatase 131 49 - 135 IU/L  Comment:               **Please note reference interval change**   AST 31 0 - 40 IU/L   ALT 77 (H) 0 - 32 IU/L  Hemoglobin A1c     Status: Abnormal   Collection Time: 04/12/24 10:12 AM  Result Value Ref Range   Hgb A1c MFr Bld 6.8 (H) 4.8 - 5.6 %    Comment:          Prediabetes: 5.7 - 6.4          Diabetes: >6.4          Glycemic control for adults with diabetes: <7.0    Est. average glucose Bld gHb Est-mCnc 148 mg/dL  Lipid panel     Status: Abnormal   Collection Time: 04/12/24 10:12 AM  Result Value Ref Range   Cholesterol, Total 243 (H) 100 - 199 mg/dL   Triglycerides 624 (H) 0 - 149 mg/dL   HDL 41 >60 mg/dL   VLDL Cholesterol Cal 68 (H) 5 - 40 mg/dL   LDL Chol Calc (NIH) 865 (H) 0 - 99 mg/dL   Chol/HDL Ratio 5.9 (H) 0.0 - 4.4 ratio    Comment:                                   T. Chol/HDL Ratio                                             Men  Women                               1/2 Avg.Risk  3.4    3.3                                   Avg.Risk  5.0    4.4                                2X Avg.Risk  9.6    7.1                                3X Avg.Risk 23.4   11.0   TSH     Status: None   Collection Time: 04/12/24 10:12 AM   Result Value Ref Range   TSH 0.609 0.450 - 4.500 uIU/mL  Vitamin D  (25 hydroxy)     Status: Abnormal   Collection Time: 04/12/24 10:12 AM  Result Value Ref Range   Vit D, 25-Hydroxy 17.6 (L) 30.0 - 100.0 ng/mL    Comment: Vitamin D  deficiency has been defined by the Institute of Medicine and an Endocrine Society practice guideline as a level of serum 25-OH vitamin D  less than 20 ng/mL (1,2). The Endocrine Society went on to further define vitamin D  insufficiency as a level between 21 and 29 ng/mL (2). 1. IOM (Institute of Medicine). 2010. Dietary reference    intakes for calcium  and D. [place]: The    [employer]. 2. Holick MF, Binkley Leeds, Bischoff-Ferrari HA, et al.  Evaluation, treatment, and prevention of vitamin D     deficiency: an Endocrine Society clinical practice    guideline. JCEM. 2011 Jul; 96(7):1911-30.   Vitamin B12     Status: None   Collection Time: 04/12/24 10:12 AM  Result Value Ref Range   Vitamin B-12 508 232 - 1,245 pg/mL  POC CREATINE & ALBUMIN,URINE     Status: None   Collection Time: 04/13/24 11:47 AM  Result Value Ref Range   Microalbumin Ur, POC 30 mg/L   Creatinine, POC 50 mg/dL   Albumin/Creatinine Ratio, Urine, POC 30-300     Allergies as of 06/05/2024       Reactions   Semaglutide Nausea And Vomiting   Metoprolol  Tartrate    Headaches, tolerates metoprolol  succinate         Medication List        Accurate as of June 05, 2024  2:52 PM. If you have any questions, ask your nurse or doctor.          acarbose  100 MG tablet Commonly known as: PRECOSE  Take 1 tablet (100 mg total) by mouth 3 (three) times daily.   acetaminophen  500 MG tablet Commonly known as: TYLENOL  Take 1,000 mg by mouth every 6 (six) hours as needed for moderate pain.   albuterol 108 (90 Base) MCG/ACT inhaler Commonly known as: VENTOLIN HFA Inhale 1 puff into the lungs every 6 (six) hours as needed for shortness of breath.   aspirin EC  81 MG tablet Take 81 mg by mouth daily. Swallow whole.   baclofen  10 MG tablet Commonly known as: LIORESAL  Take 1 tablet (10 mg total) by mouth daily.   citalopram  40 MG tablet Commonly known as: CELEXA  Take 1 tablet (40 mg total) by mouth at bedtime.   clopidogrel  75 MG tablet Commonly known as: PLAVIX  Take 1 tablet (75 mg total) by mouth daily.   empagliflozin  25 MG Tabs tablet Commonly known as: Jardiance  Take 1 tablet (25 mg total) by mouth daily.   ezetimibe  10 MG tablet Commonly known as: ZETIA  TAKE 1 TABLET(10 MG) BY MOUTH DAILY   glipiZIDE  10 MG 24 hr tablet Commonly known as: GLUCOTROL  XL Take 1 tablet (10 mg total) by mouth daily.   lisinopril  5 MG tablet Commonly known as: ZESTRIL  Take 1 tablet (5 mg total) by mouth daily.   metoprolol  succinate 25 MG 24 hr tablet Commonly known as: TOPROL -XL TAKE 1 TABLET BY MOUTH EVERY DAY   ondansetron  4 MG tablet Commonly known as: ZOFRAN  Take 1 tablet (4 mg total) by mouth every 8 (eight) hours as needed for nausea or vomiting.   rosuvastatin  40 MG tablet Commonly known as: CRESTOR  TAKE 1 TABLET(40 MG) BY MOUTH DAILY   traZODone  50 MG tablet Commonly known as: DESYREL  TAKE 1 TABLET BY MOUTH EVERY NIGHT AT BEDTIME AS NEEDED FOR SLEEP   Trulicity  4.5 MG/0.5ML Soaj Generic drug: Dulaglutide  Inject 4.5 mg as directed once a week.            Assessment & Plan Pain and swelling of right ankle Getting xr of ankle today. Will call pt with results when available and determine next steps based on results.     Return as previously scheduled unless not improving.  Total time spent: 20 minutes  ALAN CHRISTELLA ARRANT, FNP  06/05/2024   This document may have been prepared by [facility] Voice Recognition software and as such may include unintentional dictation errors.

## 2024-06-08 ENCOUNTER — Ambulatory Visit: Payer: Self-pay

## 2024-06-13 ENCOUNTER — Encounter: Payer: Self-pay | Admitting: Family

## 2024-06-19 ENCOUNTER — Other Ambulatory Visit: Payer: Self-pay | Admitting: Family

## 2024-07-01 ENCOUNTER — Emergency Department

## 2024-07-01 ENCOUNTER — Other Ambulatory Visit: Payer: Self-pay

## 2024-07-01 ENCOUNTER — Emergency Department
Admission: EM | Admit: 2024-07-01 | Discharge: 2024-07-01 | Disposition: A | Discharge status: Home / Self Care | Attending: Emergency Medicine | Admitting: Emergency Medicine

## 2024-07-01 DIAGNOSIS — W01198A Fall on same level from slipping, tripping and stumbling with subsequent striking against other object, initial encounter: Secondary | ICD-10-CM | POA: Insufficient documentation

## 2024-07-01 DIAGNOSIS — M25511 Pain in right shoulder: Secondary | ICD-10-CM | POA: Insufficient documentation

## 2024-07-01 DIAGNOSIS — I10 Essential (primary) hypertension: Secondary | ICD-10-CM | POA: Insufficient documentation

## 2024-07-01 DIAGNOSIS — W19XXXA Unspecified fall, initial encounter: Secondary | ICD-10-CM

## 2024-07-01 DIAGNOSIS — M25562 Pain in left knee: Secondary | ICD-10-CM | POA: Insufficient documentation

## 2024-07-01 DIAGNOSIS — E119 Type 2 diabetes mellitus without complications: Secondary | ICD-10-CM | POA: Insufficient documentation

## 2024-07-01 DIAGNOSIS — I2510 Atherosclerotic heart disease of native coronary artery without angina pectoris: Secondary | ICD-10-CM | POA: Insufficient documentation

## 2024-07-01 NOTE — ED Triage Notes (Signed)
 Pt to ED with family member after mechanical fall today from tripping. States fell on to L knee, R shoulder and top of head. No obvious injuries.

## 2024-07-01 NOTE — ED Notes (Signed)
 3/10 pain rating on top of head.

## 2024-07-01 NOTE — ED Provider Notes (Signed)
 [facility] Emergency Department Provider Note     Event Date/Time   First MD Initiated Contact with Patient 07/01/24 1634     (approximate)   History   Fall, Knee Pain, and Shoulder Pain   HPI  Leah Hinton is a 58 y.o. female with a past medical history of HTN, CAD and diabetes presents to the ED following a mechanical fall after tripping over a dog wagon.  Patient reports she fell onto her left knee and then hit her head against a concrete column.  She denies LOC, nausea or headache or dizziness.  Endorses anticoagulation use with aspirin and Plavix .  Also reports on the fall down she hit her right shoulder and has increased pain with range of motion.  Also notes pain shoots from neck down to right shoulder.     Physical Exam   Triage Vital Signs: ED Triage Vitals  Encounter Vitals Group     BP 07/01/24 1552 133/63     Girls Systolic BP Percentile --      Girls Diastolic BP Percentile --      Boys Systolic BP Percentile --      Boys Diastolic BP Percentile --      Pulse Rate 07/01/24 1552 63     Resp 07/01/24 1552 17     Temp 07/01/24 1552 98.4 F (36.9 C)     Temp Source 07/01/24 1552 Oral     SpO2 07/01/24 1552 98 %     Weight 07/01/24 1555 128 lb (58.1 kg)     Height 07/01/24 1555 5' 3 (1.6 m)     Head Circumference --      Peak Flow --      Pain Score 07/01/24 1554 7     Pain Loc --      Pain Education --      Exclude from Growth Chart --     Most recent vital signs: Vitals:   07/01/24 1552  BP: 133/63  Pulse: 63  Resp: 17  Temp: 98.4 F (36.9 C)  SpO2: 98%    General: Well appearing and comfortable. Alert and oriented. INAD.  Skin:  Abrasion noted to the left knee   Head:  NCAT.  Eyes:  PERRLA. EOMI.  Ears:  No postauricular ecchymosis. Neck:   No midline cervical spine tenderness to palpation. CV:  Good peripheral perfusion. RRR.   RESP:  Normal effort. LCTAB. No retractions.  ABD:  No distention. Soft, Non  tender.  MSK:   Full ROM in all joints. No swelling, deformity.  Mild tenderness to left suprapatella region.  Tenderness with right shoulder extension.  No visible deformities or crepitus noted with palpation. NEURO: Cranial nerves intact. No focal deficits. Speech clear. Sensation and motor function intact. Normal muscle strength of UE & LE. Gait is steady.   ED Results / Procedures / Treatments   Labs (all labs ordered are listed, but only abnormal results are displayed) Labs Reviewed - No data to display  RADIOLOGY  I personally viewed and evaluated these images as part of my medical decision making, as well as reviewing the written report by the radiologist.  CT Cervical Spine Wo Contrast Result Date: 07/01/2024 EXAM: CT CERVICAL SPINE WITHOUT CONTRAST 07/01/2024 05:39:45 PM TECHNIQUE: CT of the cervical spine was performed without the administration of intravenous contrast. Multiplanar reformatted images are provided for review. Automated exposure control, iterative reconstruction, and/or weight based adjustment of the mA/kV was utilized to reduce the  radiation dose to as low as reasonably achievable. COMPARISON: None available. CLINICAL HISTORY: fall FINDINGS: BONES AND ALIGNMENT: No acute fracture or traumatic malalignment. DEGENERATIVE CHANGES: Mild degenerative changes of the lower cervical spine. SOFT TISSUES: No prevertebral soft tissue swelling. IMPRESSION: 1. No evidence of acute traumatic injury. Electronically signed by: Pinkie Pebbles MD 07/01/2024 05:47 PM EST RP Workstation: [id number]   CT Head Wo Contrast Result Date: 07/01/2024 EXAM: CT HEAD WITHOUT 07/01/2024 05:39:45 PM TECHNIQUE: CT of the head was performed without the administration of intravenous contrast. Automated exposure control, iterative reconstruction, and/or weight based adjustment of the mA/kV was utilized to reduce the radiation dose to as low as reasonably achievable. COMPARISON: None available. CLINICAL  HISTORY: fall FINDINGS: BRAIN AND VENTRICLES: No acute intracranial hemorrhage. No mass effect or midline shift. No extra-axial fluid collection. No evidence of acute infarct. No hydrocephalus. ORBITS: No acute abnormality. SINUSES AND MASTOIDS: No acute abnormality. SOFT TISSUES AND SKULL: No acute skull fracture. No acute soft tissue abnormality. IMPRESSION: 1. No acute intracranial abnormality. Electronically signed by: Pinkie Pebbles MD 07/01/2024 05:46 PM EST RP Workstation: [id number]   DG Knee Complete 4 Views Left Result Date: 07/01/2024 CLINICAL DATA:  Fall with right shoulder and left knee injury. EXAM: RIGHT SHOULDER - 2+ VIEW; LEFT KNEE - COMPLETE 4+ VIEW COMPARISON:  01/12/2024. FINDINGS: Left knee: There is no evidence of acute fracture or dislocation. There is a trace suprapatellar joint effusion. Mild degenerative changes are present in the patellofemoral compartment. Enthesopathic changes are noted at the patella. Vascular calcifications are present. Right shoulder: There is no evidence of acute fracture or dislocation. Degenerative changes are present at the acromioclavicular and glenohumeral joints. The soft tissues are within normal limits. Sternotomy wires are noted. IMPRESSION: 1. No acute fracture or dislocation at the right shoulder or left knee. 2. Degenerative changes at the right shoulder and knee. 3. Trace suprapatellar joint effusion. Electronically Signed   By: Leita Birmingham M.D.   On: 07/01/2024 17:43   DG Shoulder Right Result Date: 07/01/2024 CLINICAL DATA:  Fall with right shoulder and left knee injury. EXAM: RIGHT SHOULDER - 2+ VIEW; LEFT KNEE - COMPLETE 4+ VIEW COMPARISON:  01/12/2024. FINDINGS: Left knee: There is no evidence of acute fracture or dislocation. There is a trace suprapatellar joint effusion. Mild degenerative changes are present in the patellofemoral compartment. Enthesopathic changes are noted at the patella. Vascular calcifications are present. Right  shoulder: There is no evidence of acute fracture or dislocation. Degenerative changes are present at the acromioclavicular and glenohumeral joints. The soft tissues are within normal limits. Sternotomy wires are noted. IMPRESSION: 1. No acute fracture or dislocation at the right shoulder or left knee. 2. Degenerative changes at the right shoulder and knee. 3. Trace suprapatellar joint effusion. Electronically Signed   By: Leita Birmingham M.D.   On: 07/01/2024 17:43   PROCEDURES:  Critical Care performed: No  Procedures  MEDICATIONS ORDERED IN ED: Medications - No data to display  IMPRESSION / MDM / ASSESSMENT AND PLAN / ED COURSE  I reviewed the triage vital signs and the nursing notes.                             Clinical Course as of 07/01/24 1818  Sat Jul 01, 2024  1808 DG Knee Complete 4 Views Left IMPRESSION: 1. No acute fracture or dislocation at the right shoulder or left knee. 2. Degenerative changes at the right shoulder and  knee. 3. Trace suprapatellar joint effusion.   [MH]  1808 DG Shoulder Right [MH]  1808 CT Head Wo Contrast IMPRESSION: 1. No acute intracranial abnormality.   [MH]  1809 CT Cervical Spine Wo Contrast IMPRESSION: 1. No evidence of acute traumatic injury.   [MH]    Clinical Course User Index [MH] Margrette Monte A, PA-C    58 y.o. female presents to the emergency department for evaluation and treatment of mechanical fall. See HPI for further details.   Differential diagnosis includes, but is not limited to ICH, fracture, dislocation, effusion, strain  Patient's presentation is most consistent with acute complicated illness / injury requiring diagnostic workup.  Patient is alert and oriented.  She is hemoglobin stable.  Physical exam findings are stated above overall benign.  Given mechanism of injury and positive anticoagulation use plan to obtain CT head, cervical.  Will obtain left knee and right shoulder x-ray.  Patient was offered pain  medication however declined.  ----------------------------------------- 6:16 PM on 07/01/2024 -----------------------------------------  Imaging is reassuring.  Trace suprapatellar joint effusion noted.  Will place patient in Ace wrap.  Education on RICE therapy provided.  Patient stable condition for discharge home.  ED return precautions discussed.  FINAL CLINICAL IMPRESSION(S) / ED DIAGNOSES   Final diagnoses:  Fall, initial encounter  Acute pain of right shoulder  Acute pain of left knee     Rx / DC Orders   ED Discharge Orders     None        Note:  This document was prepared using Dragon voice recognition software and may include unintentional dictation errors.    Margrette, Tristine Langi A, PA-C 07/01/24 1819    Jacolyn Pae, MD 07/01/24 KENITH

## 2024-07-01 NOTE — Discharge Instructions (Addendum)
 Your evaluated in the ED following a fall.  Your head CT and cervical spine CT are normal.  There is no skull fracture or brain bleed.    Your right shoulder x-ray is normal.  Your left knee x-ray shows trace suprapatellar joint effusion which will heal on its own.  He will need to apply ice to the affected area and elevate the knee is much as possible to help reduce swelling.  Limit physical activity for 1 week.  Follow-up with your primary care provider as needed.  Pain control:  Ibuprofen (motrin/aleve karolyn) - You can take 3 tablets (600 mg) every 6 hours as needed for pain/fever.  Acetaminophen  (tylenol ) - You can take 2 extra strength tablets (1000 mg) every 6 hours as needed for pain/fever.  You can alternate these medications or take them together.  Make sure you eat food/drink water when taking these medications.

## 2024-07-06 ENCOUNTER — Ambulatory Visit (HOSPITAL_COMMUNITY)

## 2024-07-06 ENCOUNTER — Ambulatory Visit: Admission: RE | Admit: 2024-07-06 | Source: Ambulatory Visit

## 2024-07-06 ENCOUNTER — Encounter: Payer: Self-pay | Admitting: Internal Medicine

## 2024-07-06 ENCOUNTER — Ambulatory Visit: Admitting: Internal Medicine

## 2024-07-06 VITALS — BP 122/82 | HR 67 | Ht 63.0 in | Wt 122.8 lb

## 2024-07-06 DIAGNOSIS — E782 Mixed hyperlipidemia: Secondary | ICD-10-CM | POA: Diagnosis not present

## 2024-07-06 DIAGNOSIS — Z794 Long term (current) use of insulin: Secondary | ICD-10-CM | POA: Diagnosis not present

## 2024-07-06 DIAGNOSIS — I25798 Atherosclerosis of other coronary artery bypass graft(s) with other forms of angina pectoris: Secondary | ICD-10-CM | POA: Diagnosis not present

## 2024-07-06 DIAGNOSIS — S0990XS Unspecified injury of head, sequela: Secondary | ICD-10-CM

## 2024-07-06 DIAGNOSIS — R42 Dizziness and giddiness: Secondary | ICD-10-CM | POA: Diagnosis not present

## 2024-07-06 DIAGNOSIS — E1165 Type 2 diabetes mellitus with hyperglycemia: Secondary | ICD-10-CM

## 2024-07-06 DIAGNOSIS — R0602 Shortness of breath: Secondary | ICD-10-CM | POA: Diagnosis not present

## 2024-07-06 DIAGNOSIS — E1159 Type 2 diabetes mellitus with other circulatory complications: Secondary | ICD-10-CM | POA: Diagnosis not present

## 2024-07-06 DIAGNOSIS — E1169 Type 2 diabetes mellitus with other specified complication: Secondary | ICD-10-CM | POA: Insufficient documentation

## 2024-07-06 DIAGNOSIS — I152 Hypertension secondary to endocrine disorders: Secondary | ICD-10-CM | POA: Diagnosis not present

## 2024-07-06 MED ORDER — ALBUTEROL SULFATE HFA 108 (90 BASE) MCG/ACT IN AERS: 1.0000 | INHALATION_SPRAY | Freq: Four times a day (QID) | RESPIRATORY_TRACT | 0 refills | Status: AC | PRN

## 2024-07-06 MED ORDER — GADOBUTROL 1 MMOL/ML IV SOLN
6.0000 mL | Freq: Once | INTRAVENOUS | Status: AC | PRN
  Administered 2024-07-06: 19:00:00 6 mL via INTRAVENOUS

## 2024-07-06 NOTE — Progress Notes (Signed)
 Established Patient Office Visit  Subjective:  Patient ID: Leah Hinton, female    DOB: 03/06/1966  Age: 58 y.o. MRN: [id number]  Chief Complaint  Patient presents with   Acute Visit    Pain in left knee & fell and hit head. Having dizziness     Patient comes in with complaints of worsening dizziness since her emergency room visit on 07/01/2024.  Patient went to ED with a history of fall, tripping on a dog wagon, hitting her head on the brakes as well as injuring her left knee and right shoulder.  Patient is currently on Plavix  and aspirin.  The CT scan of her brain and the spine were unremarkable.  X-rays of her knee showed prepatellar effusion, it is currently wrapped in an Ace wrap.  Patient reports that she was not having dizziness on the day of her injury, but next day on Sunday she started feeling dizziness which lasts a few minutes and then subsides.  Sometimes it is positional but mostly it is spontaneous.  Over the last 5 days it is persisting and she is concerned.  She has history of chronic nausea, no vomiting, no chest pain, mild shortness of breath and no palpitations.  She does not feel any tingling or numbness around her mouth or face.  No motor weakness.  However with her being on aspirin and Plavix  will schedule a stat MRI of the brain.    No other concerns at this time.   Past Medical History:  Diagnosis Date   Anxiety    Coronary artery disease    Depression    Diabetes mellitus without complication (HCC)    Hypertension    Myocardial infarction (HCC)    PONV (postoperative nausea and vomiting)     Past Surgical History:  Procedure Laterality Date   ABDOMINAL HYSTERECTOMY  2012   CESAREAN SECTION     x 2   CORONARY ARTERY BYPASS GRAFT     GANGLION CYST EXCISION Left    TRIGGER FINGER RELEASE Left 07/03/2021   Procedure: RELEASE TRIGGER FINGER/A-1 PULLEY -  INCISION, TENDON SHEATH (SURG);  Surgeon: Cleotilde Barrio, MD;  Location: [facility] ORS;  Service:  Orthopedics;  Laterality: Left;    Social History   Socioeconomic History   Marital status: Widowed    Spouse name: Not on file   Number of children: Not on file   Years of education: Not on file   Highest education level: Not on file  Occupational History   Not on file  Tobacco Use   Smoking status: Never    Passive exposure: Never   Smokeless tobacco: Never  Vaping Use   Vaping status: Never Used  Substance and Sexual Activity   Alcohol use: Never   Drug use: Never   Sexual activity: Not on file  Other Topics Concern   Not on file  Social History Narrative   Lives with mother and step dad.   Social Drivers of Health   Tobacco Use: Low Risk (07/06/2024)   Patient History    Smoking Tobacco Use: Never    Smokeless Tobacco Use: Never    Passive Exposure: Never  Financial Resource Strain: Not on file  Food Insecurity: Not on file  Transportation Needs: Not on file  Physical Activity: Not on file  Stress: Not on file  Social Connections: Not on file  Intimate Partner Violence: Not on file  Depression (PHQ2-9): High Risk (09/16/2023)   Depression (PHQ2-9)    PHQ-2 Score: 11  Alcohol Screen: Not on file  Housing: Not on file  Utilities: Not on file  Health Literacy: Not on file    Family History  Problem Relation Age of Onset   Breast cancer Maternal Grandmother 60    Allergies[1]  Show/hide medication list[2]  Review of Systems  Constitutional: Negative.  Negative for chills, fever and malaise/fatigue.  HENT: Negative.  Negative for congestion and sore throat.   Eyes: Negative.  Negative for blurred vision and pain.  Respiratory: Negative.  Negative for cough and shortness of breath.   Cardiovascular: Negative.  Negative for chest pain, palpitations and leg swelling.  Gastrointestinal:  Positive for nausea. Negative for abdominal pain, blood in stool, constipation, diarrhea, heartburn, melena and vomiting.  Genitourinary: Negative.  Negative for dysuria,  flank pain, frequency and urgency.  Musculoskeletal: Negative.  Negative for joint pain and myalgias.  Skin: Negative.   Neurological:  Positive for dizziness. Negative for tingling, sensory change, weakness and headaches.  Endo/Heme/Allergies: Negative.   Psychiatric/Behavioral: Negative.  Negative for depression and suicidal ideas. The patient is not nervous/anxious.        Objective:   BP 122/82   Pulse 67   Ht 5' 3 (1.6 m)   Wt 122 lb 12.8 oz (55.7 kg)   SpO2 97%   BMI 21.75 kg/m   Vitals:   07/06/24 1141  BP: 122/82  Pulse: 67  Height: 5' 3 (1.6 m)  Weight: 122 lb 12.8 oz (55.7 kg)  SpO2: 97%  BMI (Calculated): 21.76    Physical Exam Vitals and nursing note reviewed.  Constitutional:      Appearance: Normal appearance.  HENT:     Head: Normocephalic and atraumatic.     Nose: Nose normal.     Mouth/Throat:     Mouth: Mucous membranes are moist.     Pharynx: Oropharynx is clear.  Eyes:     Conjunctiva/sclera: Conjunctivae normal.     Pupils: Pupils are equal, round, and reactive to light.  Cardiovascular:     Rate and Rhythm: Normal rate and regular rhythm.     Pulses: Normal pulses.     Heart sounds: Normal heart sounds. No murmur heard. Pulmonary:     Effort: Pulmonary effort is normal.     Breath sounds: Normal breath sounds. No wheezing.  Abdominal:     General: Bowel sounds are normal.     Palpations: Abdomen is soft.     Tenderness: There is no abdominal tenderness. There is no right CVA tenderness or left CVA tenderness.  Musculoskeletal:        General: Swelling (left knee) present. Normal range of motion.     Cervical back: Normal range of motion.     Right lower leg: No edema.     Left lower leg: No edema.  Skin:    General: Skin is warm and dry.  Neurological:     General: No focal deficit present.     Mental Status: She is alert and oriented to person, place, and time.  Psychiatric:        Mood and Affect: Mood normal.         Behavior: Behavior normal.      No results found for any visits on 07/06/24.  Recent Results (from the past 2160 hours)  CBC with Differential/Platelet     Status: Abnormal   Collection Time: 04/12/24 10:12 AM  Result Value Ref Range   WBC 7.8 3.4 - 10.8 x10E3/uL   RBC 5.57 (H) 3.77 -  5.28 x10E6/uL   Hemoglobin 16.4 (H) 11.1 - 15.9 g/dL   Hematocrit 49.9 (H) 65.9 - 46.6 %   MCV 90 79 - 97 fL   MCH 29.4 26.6 - 33.0 pg   MCHC 32.8 31.5 - 35.7 g/dL   RDW 87.1 88.2 - 84.5 %   Platelets 263 150 - 450 x10E3/uL   Neutrophils 51 Not Estab. %   Lymphs 34 Not Estab. %   Monocytes 10 Not Estab. %   Eos 3 Not Estab. %   Basos 1 Not Estab. %   Neutrophils Absolute 4.1 1.4 - 7.0 x10E3/uL   Lymphocytes Absolute 2.6 0.7 - 3.1 x10E3/uL   Monocytes Absolute 0.7 0.1 - 0.9 x10E3/uL   EOS (ABSOLUTE) 0.2 0.0 - 0.4 x10E3/uL   Basophils Absolute 0.1 0.0 - 0.2 x10E3/uL   Immature Granulocytes 1 Not Estab. %   Immature Grans (Abs) 0.1 0.0 - 0.1 x10E3/uL  CMP14+EGFR     Status: Abnormal   Collection Time: 04/12/24 10:12 AM  Result Value Ref Range   Glucose 207 (H) 70 - 99 mg/dL   BUN 21 6 - 24 mg/dL   Creatinine, Ser 9.11 0.57 - 1.00 mg/dL   eGFR 76 >40 fO/fpw/8.26   BUN/Creatinine Ratio 24 (H) 9 - 23   Sodium 139 134 - 144 mmol/L   Potassium 4.7 3.5 - 5.2 mmol/L   Chloride 102 96 - 106 mmol/L   CO2 21 20 - 29 mmol/L   Calcium  9.8 8.7 - 10.2 mg/dL   Total Protein 7.1 6.0 - 8.5 g/dL   Albumin 4.5 3.8 - 4.9 g/dL   Globulin, Total 2.6 1.5 - 4.5 g/dL   Bilirubin Total 0.3 0.0 - 1.2 mg/dL   Alkaline Phosphatase 131 49 - 135 IU/L    Comment:               **Please note reference interval change**   AST 31 0 - 40 IU/L   ALT 77 (H) 0 - 32 IU/L  Hemoglobin A1c     Status: Abnormal   Collection Time: 04/12/24 10:12 AM  Result Value Ref Range   Hgb A1c MFr Bld 6.8 (H) 4.8 - 5.6 %    Comment:          Prediabetes: 5.7 - 6.4          Diabetes: >6.4          Glycemic control for adults with  diabetes: <7.0    Est. average glucose Bld gHb Est-mCnc 148 mg/dL  Lipid panel     Status: Abnormal   Collection Time: 04/12/24 10:12 AM  Result Value Ref Range   Cholesterol, Total 243 (H) 100 - 199 mg/dL   Triglycerides 624 (H) 0 - 149 mg/dL   HDL 41 >60 mg/dL   VLDL Cholesterol Cal 68 (H) 5 - 40 mg/dL   LDL Chol Calc (NIH) 865 (H) 0 - 99 mg/dL   Chol/HDL Ratio 5.9 (H) 0.0 - 4.4 ratio    Comment:                                   T. Chol/HDL Ratio                                             Men  Women  1/2 Avg.Risk  3.4    3.3                                   Avg.Risk  5.0    4.4                                2X Avg.Risk  9.6    7.1                                3X Avg.Risk 23.4   11.0   TSH     Status: None   Collection Time: 04/12/24 10:12 AM  Result Value Ref Range   TSH 0.609 0.450 - 4.500 uIU/mL  Vitamin D  (25 hydroxy)     Status: Abnormal   Collection Time: 04/12/24 10:12 AM  Result Value Ref Range   Vit D, 25-Hydroxy 17.6 (L) 30.0 - 100.0 ng/mL    Comment: Vitamin D  deficiency has been defined by the Institute of Medicine and an Endocrine Society practice guideline as a level of serum 25-OH vitamin D  less than 20 ng/mL (1,2). The Endocrine Society went on to further define vitamin D  insufficiency as a level between 21 and 29 ng/mL (2). 1. IOM (Institute of Medicine). 2010. Dietary reference    intakes for calcium  and D. [place]: The    [employer]. 2. Holick MF, Binkley Olga, Bischoff-Ferrari HA, et al.    Evaluation, treatment, and prevention of vitamin D     deficiency: an Endocrine Society clinical practice    guideline. JCEM. 2011 Jul; 96(7):1911-30.   Vitamin B12     Status: None   Collection Time: 04/12/24 10:12 AM  Result Value Ref Range   Vitamin B-12 508 232 - 1,245 pg/mL  POC CREATINE & ALBUMIN,URINE     Status: None   Collection Time: 04/13/24 11:47 AM  Result Value Ref Range   Microalbumin Ur, POC 30  mg/L   Creatinine, POC 50 mg/dL   Albumin/Creatinine Ratio, Urine, POC 30-300       Assessment & Plan:  Stat MRI of the brain.  Follow-up as scheduled. 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  Total time spent: 30 minutes. This time includes review of previous notes and results and patient face to face interaction during today's visit.    FERNAND FREDY RAMAN, MD  07/06/2024   This document may have been prepared by [facility] Voice Recognition software and as such may include unintentional dictation errors.     [1]  Allergies Allergen Reactions   Semaglutide Nausea And Vomiting   Metoprolol  Tartrate     Headaches, tolerates metoprolol  succinate   [  2]  Outpatient Medications Prior to Visit  Medication Sig   acarbose  (PRECOSE ) 100 MG tablet Take 1 tablet (100 mg total) by mouth 3 (three) times daily.   acetaminophen  (TYLENOL ) 500 MG tablet Take 1,000 mg by mouth every 6 (six) hours as needed for moderate pain.   aspirin EC 81 MG tablet Take 81 mg by mouth daily. Swallow whole.   citalopram  (CELEXA ) 40 MG tablet Take 1 tablet (40 mg total) by mouth at bedtime.   clopidogrel  (PLAVIX ) 75 MG tablet Take 1 tablet (75 mg total) by mouth daily.   Dulaglutide  (TRULICITY ) 4.5 MG/0.5ML SOAJ Inject 4.5 mg as directed once a week.   empagliflozin  (JARDIANCE ) 25 MG TABS tablet Take 1 tablet (25 mg total) by mouth daily.   ezetimibe  (ZETIA ) 10 MG tablet TAKE 1 TABLET(10 MG) BY MOUTH  DAILY   glipiZIDE  (GLUCOTROL  XL) 10 MG 24 hr tablet TAKE 1 TABLET(10 MG) BY MOUTH DAILY   lisinopril  (ZESTRIL ) 5 MG tablet TAKE 1 TABLET(5 MG) BY MOUTH DAILY   metoprolol  succinate (TOPROL -XL) 25 MG 24 hr tablet TAKE 1 TABLET BY MOUTH EVERY DAY   ondansetron  (ZOFRAN ) 4 MG tablet Take 1 tablet (4 mg total) by mouth every 8 (eight) hours as needed for nausea or vomiting.   rosuvastatin  (CRESTOR ) 40 MG tablet TAKE 1 TABLET(40 MG) BY MOUTH DAILY   traZODone  (DESYREL ) 50 MG tablet TAKE 1 TABLET BY MOUTH EVERY NIGHT AT BEDTIME AS NEEDED FOR SLEEP   baclofen  (LIORESAL ) 10 MG tablet Take 1 tablet (10 mg total) by mouth daily. (Patient not taking: Reported on 07/06/2024)   [DISCONTINUED] albuterol  (VENTOLIN  HFA) 108 (90 Base) MCG/ACT inhaler Inhale 1 puff into the lungs every 6 (six) hours as needed for shortness of breath. (Patient not taking: Reported on 07/06/2024)   No facility-administered medications prior to visit.

## 2024-07-07 ENCOUNTER — Ambulatory Visit: Payer: Self-pay | Admitting: Internal Medicine

## 2024-07-07 NOTE — Progress Notes (Signed)
 Patient notified

## 2024-07-12 ENCOUNTER — Other Ambulatory Visit

## 2024-07-13 LAB — HEMOGLOBIN A1C
Est. average glucose Bld gHb Est-mCnc: 324 mg/dL
Hgb A1c MFr Bld: 12.9 % — ABNORMAL HIGH (ref 4.8–5.6)

## 2024-07-13 LAB — CBC WITH DIFFERENTIAL/PLATELET
Basophils Absolute: 0 x10E3/uL (ref 0.0–0.2)
Basos: 1 %
EOS (ABSOLUTE): 0.1 x10E3/uL (ref 0.0–0.4)
Eos: 1 %
Hematocrit: 42.9 % (ref 34.0–46.6)
Hemoglobin: 13.6 g/dL (ref 11.1–15.9)
Immature Grans (Abs): 0 x10E3/uL (ref 0.0–0.1)
Immature Granulocytes: 0 %
Lymphocytes Absolute: 1.6 x10E3/uL (ref 0.7–3.1)
Lymphs: 25 %
MCH: 30.6 pg (ref 26.6–33.0)
MCHC: 31.7 g/dL (ref 31.5–35.7)
MCV: 97 fL (ref 79–97)
Monocytes Absolute: 0.4 x10E3/uL (ref 0.1–0.9)
Monocytes: 6 %
Neutrophils Absolute: 4.4 x10E3/uL (ref 1.4–7.0)
Neutrophils: 67 %
Platelets: 238 x10E3/uL (ref 150–450)
RBC: 4.44 x10E6/uL (ref 3.77–5.28)
RDW: 12.2 % (ref 11.7–15.4)
WBC: 6.4 x10E3/uL (ref 3.4–10.8)

## 2024-07-13 LAB — VITAMIN D 25 HYDROXY (VIT D DEFICIENCY, FRACTURES): Vit D, 25-Hydroxy: 22.4 ng/mL — ABNORMAL LOW (ref 30.0–100.0)

## 2024-07-13 LAB — CMP14+EGFR
ALT: 50 IU/L — ABNORMAL HIGH (ref 0–32)
AST: 20 IU/L (ref 0–40)
Albumin: 4.1 g/dL (ref 3.8–4.9)
Alkaline Phosphatase: 62 IU/L (ref 49–135)
BUN/Creatinine Ratio: 18 (ref 9–23)
BUN: 14 mg/dL (ref 6–24)
Bilirubin Total: 0.3 mg/dL (ref 0.0–1.2)
CO2: 22 mmol/L (ref 20–29)
Calcium: 9.5 mg/dL (ref 8.7–10.2)
Chloride: 102 mmol/L (ref 96–106)
Creatinine, Ser: 0.77 mg/dL (ref 0.57–1.00)
Globulin, Total: 2.1 g/dL (ref 1.5–4.5)
Glucose: 331 mg/dL — ABNORMAL HIGH (ref 70–99)
Potassium: 4.8 mmol/L (ref 3.5–5.2)
Sodium: 139 mmol/L (ref 134–144)
Total Protein: 6.2 g/dL (ref 6.0–8.5)
eGFR: 89 mL/min/1.73

## 2024-07-13 LAB — LIPID PANEL
Chol/HDL Ratio: 2.5 ratio (ref 0.0–4.4)
Cholesterol, Total: 111 mg/dL (ref 100–199)
HDL: 45 mg/dL
LDL Chol Calc (NIH): 50 mg/dL (ref 0–99)
Triglycerides: 78 mg/dL (ref 0–149)
VLDL Cholesterol Cal: 16 mg/dL (ref 5–40)

## 2024-07-13 LAB — TSH: TSH: 2.3 u[IU]/mL (ref 0.450–4.500)

## 2024-07-13 LAB — VITAMIN B12: Vitamin B-12: 496 pg/mL (ref 232–1245)

## 2024-07-17 ENCOUNTER — Encounter: Payer: Self-pay | Admitting: Family

## 2024-07-17 ENCOUNTER — Ambulatory Visit: Payer: Self-pay

## 2024-07-17 ENCOUNTER — Ambulatory Visit: Admitting: Family

## 2024-07-17 VITALS — BP 104/68 | HR 59 | Ht 63.0 in | Wt 125.8 lb

## 2024-07-17 DIAGNOSIS — E1165 Type 2 diabetes mellitus with hyperglycemia: Secondary | ICD-10-CM | POA: Diagnosis not present

## 2024-07-17 DIAGNOSIS — E1159 Type 2 diabetes mellitus with other circulatory complications: Secondary | ICD-10-CM

## 2024-07-17 DIAGNOSIS — E782 Mixed hyperlipidemia: Secondary | ICD-10-CM | POA: Diagnosis not present

## 2024-07-17 DIAGNOSIS — I152 Hypertension secondary to endocrine disorders: Secondary | ICD-10-CM

## 2024-07-17 DIAGNOSIS — E1169 Type 2 diabetes mellitus with other specified complication: Secondary | ICD-10-CM

## 2024-07-17 MED ORDER — TRIAMCINOLONE ACETONIDE 0.1 % EX CREA: 1.0000 | TOPICAL_CREAM | Freq: Two times a day (BID) | CUTANEOUS | 0 refills | Status: AC

## 2024-07-17 NOTE — Assessment & Plan Note (Signed)
 A1C was controlled when patient had her Jardiance  and Trulicity , since she's been having trouble getting them her A1C has gone up significantly.   Will work to get her medications sorted so that we are able to keep her diabetes under control more effectively.

## 2024-07-17 NOTE — Progress Notes (Signed)
 "  Established Patient Office Visit  Subjective:  Patient ID: Leah Hinton, female    DOB: [birth year]  Age: 58 y.o. MRN: [id number]  Chief Complaint  Patient presents with   Follow-up    3 month follow up    HPI  No other concerns at this time.   Past Medical History:  Diagnosis Date   Anxiety    Coronary artery disease    Depression    Diabetes mellitus without complication (HCC)    Hypertension    Myocardial infarction (HCC)    PONV (postoperative nausea and vomiting)     Past Surgical History:  Procedure Laterality Date   ABDOMINAL HYSTERECTOMY  2012   CESAREAN SECTION     x 2   CORONARY ARTERY BYPASS GRAFT     GANGLION CYST EXCISION Left    TRIGGER FINGER RELEASE Left 07/03/2021   Procedure: RELEASE TRIGGER FINGER/A-1 PULLEY -  INCISION, TENDON SHEATH (SURG);  Surgeon: Cleotilde Barrio, MD;  Location: [facility] ORS;  Service: Orthopedics;  Laterality: Left;    Social History   Socioeconomic History   Marital status: Widowed    Spouse name: Not on file   Number of children: Not on file   Years of education: Not on file   Highest education level: Not on file  Occupational History   Not on file  Tobacco Use   Smoking status: Never    Passive exposure: Never   Smokeless tobacco: Never  Vaping Use   Vaping status: Never Used  Substance and Sexual Activity   Alcohol use: Never   Drug use: Never   Sexual activity: Not on file  Other Topics Concern   Not on file  Social History Narrative   Lives with mother and step dad.   Social Drivers of Health   Tobacco Use: Low Risk (07/17/2024)   Patient History    Smoking Tobacco Use: Never    Smokeless Tobacco Use: Never    Passive Exposure: Never  Financial Resource Strain: Not on file  Food Insecurity: Not on file  Transportation Needs: Not on file  Physical Activity: Not on file  Stress: Not on file  Social Connections: Not on file  Intimate Partner Violence: Not on file  Depression (PHQ2-9): High Risk  (09/16/2023)   Depression (PHQ2-9)    PHQ-2 Score: 11  Alcohol Screen: Not on file  Housing: Not on file  Utilities: Not on file  Health Literacy: Not on file    Family History  Problem Relation Age of Onset   Breast cancer Maternal Grandmother 60    Allergies[1]  Review of Systems  All other systems reviewed and are negative.      Objective:   BP 104/68   Pulse (!) 59   Ht 5' 3 (1.6 m)   Wt 125 lb 12.8 oz (57.1 kg)   SpO2 97%   BMI 22.28 kg/m   Vitals:   07/17/24 1001  BP: 104/68  Pulse: (!) 59  Height: 5' 3 (1.6 m)  Weight: 125 lb 12.8 oz (57.1 kg)  SpO2: 97%  BMI (Calculated): 22.29    Physical Exam Vitals and nursing note reviewed.  Constitutional:      Appearance: Normal appearance. She is normal weight.  HENT:     Head: Normocephalic.  Eyes:     Extraocular Movements: Extraocular movements intact.     Conjunctiva/sclera: Conjunctivae normal.     Pupils: Pupils are equal, round, and reactive to light.  Cardiovascular:  Rate and Rhythm: Normal rate.  Pulmonary:     Effort: Pulmonary effort is normal.  Neurological:     General: No focal deficit present.     Mental Status: She is alert and oriented to person, place, and time. Mental status is at baseline.  Psychiatric:        Mood and Affect: Mood normal.        Behavior: Behavior normal.        Thought Content: Thought content normal.      No results found for any visits on 07/17/24.  Recent Results (from the past 2160 hours)  CBC with Differential/Platelet     Status: None   Collection Time: 07/12/24  9:17 AM  Result Value Ref Range   WBC 6.4 3.4 - 10.8 x10E3/uL   RBC 4.44 3.77 - 5.28 x10E6/uL   Hemoglobin 13.6 11.1 - 15.9 g/dL   Hematocrit 57.0 65.9 - 46.6 %   MCV 97 79 - 97 fL   MCH 30.6 26.6 - 33.0 pg   MCHC 31.7 31.5 - 35.7 g/dL   RDW 87.7 88.2 - 84.5 %   Platelets 238 150 - 450 x10E3/uL   Neutrophils 67 Not Estab. %   Lymphs 25 Not Estab. %   Monocytes 6 Not Estab. %    Eos 1 Not Estab. %   Basos 1 Not Estab. %   Neutrophils Absolute 4.4 1.4 - 7.0 x10E3/uL   Lymphocytes Absolute 1.6 0.7 - 3.1 x10E3/uL   Monocytes Absolute 0.4 0.1 - 0.9 x10E3/uL   EOS (ABSOLUTE) 0.1 0.0 - 0.4 x10E3/uL   Basophils Absolute 0.0 0.0 - 0.2 x10E3/uL   Immature Granulocytes 0 Not Estab. %   Immature Grans (Abs) 0.0 0.0 - 0.1 x10E3/uL  CMP14+EGFR     Status: Abnormal   Collection Time: 07/12/24  9:17 AM  Result Value Ref Range   Glucose 331 (H) 70 - 99 mg/dL   BUN 14 6 - 24 mg/dL   Creatinine, Ser 9.22 0.57 - 1.00 mg/dL   eGFR 89 >40 fO/fpw/8.26   BUN/Creatinine Ratio 18 9 - 23   Sodium 139 134 - 144 mmol/L   Potassium 4.8 3.5 - 5.2 mmol/L   Chloride 102 96 - 106 mmol/L   CO2 22 20 - 29 mmol/L   Calcium  9.5 8.7 - 10.2 mg/dL   Total Protein 6.2 6.0 - 8.5 g/dL   Albumin 4.1 3.8 - 4.9 g/dL   Globulin, Total 2.1 1.5 - 4.5 g/dL   Bilirubin Total 0.3 0.0 - 1.2 mg/dL   Alkaline Phosphatase 62 49 - 135 IU/L   AST 20 0 - 40 IU/L   ALT 50 (H) 0 - 32 IU/L  Hemoglobin A1c     Status: Abnormal   Collection Time: 07/12/24  9:17 AM  Result Value Ref Range   Hgb A1c MFr Bld 12.9 (H) 4.8 - 5.6 %    Comment:          Prediabetes: 5.7 - 6.4          Diabetes: >6.4          Glycemic control for adults with diabetes: <7.0    Est. average glucose Bld gHb Est-mCnc 324 mg/dL  Lipid panel     Status: None   Collection Time: 07/12/24  9:17 AM  Result Value Ref Range   Cholesterol, Total 111 100 - 199 mg/dL   Triglycerides 78 0 - 149 mg/dL   HDL 45 >60 mg/dL   VLDL Cholesterol Cal  16 5 - 40 mg/dL   LDL Chol Calc (NIH) 50 0 - 99 mg/dL   Chol/HDL Ratio 2.5 0.0 - 4.4 ratio    Comment:                                   T. Chol/HDL Ratio                                             Men  Women                               1/2 Avg.Risk  3.4    3.3                                   Avg.Risk  5.0    4.4                                2X Avg.Risk  9.6    7.1                                3X  Avg.Risk 23.4   11.0   TSH     Status: None   Collection Time: 07/12/24  9:17 AM  Result Value Ref Range   TSH 2.300 0.450 - 4.500 uIU/mL  Vitamin D  (25 hydroxy)     Status: Abnormal   Collection Time: 07/12/24  9:17 AM  Result Value Ref Range   Vit D, 25-Hydroxy 22.4 (L) 30.0 - 100.0 ng/mL    Comment: Vitamin D  deficiency has been defined by the Institute of Medicine and an Endocrine Society practice guideline as a level of serum 25-OH vitamin D  less than 20 ng/mL (1,2). The Endocrine Society went on to further define vitamin D  insufficiency as a level between 21 and 29 ng/mL (2). 1. IOM (Institute of Medicine). 2010. Dietary reference    intakes for calcium  and D. [place]: The    [employer]. 2. Holick MF, Binkley , Bischoff-Ferrari HA, et al.    Evaluation, treatment, and prevention of vitamin D     deficiency: an Endocrine Society clinical practice    guideline. JCEM. 2011 Jul; 96(7):1911-30.   Vitamin B12     Status: None   Collection Time: 07/12/24  9:17 AM  Result Value Ref Range   Vitamin B-12 496 232 - 1,245 pg/mL       Assessment & Plan Hypertension associated with diabetes (HCC) Blood pressure well controlled with current medications.  Continue current therapy.  Will reassess at follow up.   Mixed hyperlipidemia Combined hyperlipidemia associated with type 2 diabetes mellitus (HCC) Continue current therapy for lipid control. Will modify as needed based on labwork results.   Type 2 diabetes mellitus with hyperglycemia, without long-term current use of insulin (HCC) A1C was controlled when patient had her Jardiance  and Trulicity , since she's been having trouble getting them her A1C has gone up significantly.   Will work to get her medications sorted so that we are able to keep her diabetes under control more effectively.  Return in about 4 months (around 11/15/2024).   Total time spent: 20 minutes  ALAN CHRISTELLA ARRANT,  FNP  07/17/2024   This document may have been prepared by [facility] Voice Recognition software and as such may include unintentional dictation errors.     [1]  Allergies Allergen Reactions   Semaglutide Nausea And Vomiting   Metoprolol  Tartrate     Headaches, tolerates metoprolol  succinate    "

## 2024-07-17 NOTE — Assessment & Plan Note (Signed)
 Blood pressure well controlled with current medications.  Continue current therapy.  Will reassess at follow up.

## 2024-07-17 NOTE — Assessment & Plan Note (Signed)
 Continue current therapy for lipid control. Will modify as needed based on labwork results.

## 2024-07-19 ENCOUNTER — Other Ambulatory Visit: Payer: Self-pay

## 2024-07-19 DIAGNOSIS — I10 Essential (primary) hypertension: Secondary | ICD-10-CM

## 2024-07-19 DIAGNOSIS — I25798 Atherosclerosis of other coronary artery bypass graft(s) with other forms of angina pectoris: Secondary | ICD-10-CM

## 2024-07-19 DIAGNOSIS — E1165 Type 2 diabetes mellitus with hyperglycemia: Secondary | ICD-10-CM

## 2024-07-19 DIAGNOSIS — I25810 Atherosclerosis of coronary artery bypass graft(s) without angina pectoris: Secondary | ICD-10-CM

## 2024-07-19 DIAGNOSIS — E119 Type 2 diabetes mellitus without complications: Secondary | ICD-10-CM

## 2024-07-19 DIAGNOSIS — E782 Mixed hyperlipidemia: Secondary | ICD-10-CM

## 2024-07-19 MED ORDER — EMPAGLIFLOZIN 25 MG PO TABS: 25.0000 mg | ORAL_TABLET | Freq: Every day | ORAL | 3 refills | Status: AC

## 2024-08-10 ENCOUNTER — Ambulatory Visit: Admitting: Cardiovascular Disease

## 2024-08-11 ENCOUNTER — Other Ambulatory Visit: Payer: Self-pay | Admitting: Cardiovascular Disease

## 2024-08-11 DIAGNOSIS — I340 Nonrheumatic mitral (valve) insufficiency: Secondary | ICD-10-CM

## 2024-08-15 ENCOUNTER — Other Ambulatory Visit: Payer: Self-pay | Admitting: Family

## 2024-08-15 ENCOUNTER — Ambulatory Visit: Admitting: Cardiovascular Disease

## 2024-08-22 ENCOUNTER — Ambulatory Visit: Admitting: Cardiovascular Disease

## 2024-08-22 ENCOUNTER — Encounter: Payer: Self-pay | Admitting: Cardiovascular Disease

## 2024-08-22 VITALS — BP 110/66 | HR 67 | Ht 63.0 in | Wt 126.2 lb

## 2024-08-22 DIAGNOSIS — E119 Type 2 diabetes mellitus without complications: Secondary | ICD-10-CM

## 2024-08-22 DIAGNOSIS — I25810 Atherosclerosis of coronary artery bypass graft(s) without angina pectoris: Secondary | ICD-10-CM

## 2024-08-22 DIAGNOSIS — I10 Essential (primary) hypertension: Secondary | ICD-10-CM | POA: Diagnosis not present

## 2024-08-22 DIAGNOSIS — I152 Hypertension secondary to endocrine disorders: Secondary | ICD-10-CM | POA: Diagnosis not present

## 2024-08-22 DIAGNOSIS — E782 Mixed hyperlipidemia: Secondary | ICD-10-CM | POA: Diagnosis not present

## 2024-08-22 DIAGNOSIS — E1165 Type 2 diabetes mellitus with hyperglycemia: Secondary | ICD-10-CM | POA: Diagnosis not present

## 2024-08-22 DIAGNOSIS — I25798 Atherosclerosis of other coronary artery bypass graft(s) with other forms of angina pectoris: Secondary | ICD-10-CM

## 2024-08-22 DIAGNOSIS — R0602 Shortness of breath: Secondary | ICD-10-CM

## 2024-08-22 DIAGNOSIS — I340 Nonrheumatic mitral (valve) insufficiency: Secondary | ICD-10-CM

## 2024-08-22 DIAGNOSIS — E1159 Type 2 diabetes mellitus with other circulatory complications: Secondary | ICD-10-CM

## 2024-08-22 DIAGNOSIS — E1169 Type 2 diabetes mellitus with other specified complication: Secondary | ICD-10-CM

## 2024-11-14 ENCOUNTER — Ambulatory Visit: Admitting: Family

## 2024-12-21 ENCOUNTER — Ambulatory Visit: Admitting: Cardiovascular Disease
